# Patient Record
Sex: Female | Born: 1975 | Race: White | Hispanic: No | Marital: Married
Health system: Southern US, Community
[De-identification: ages and names within clinical notes are randomized; demographics above are authoritative.]

## PROBLEM LIST (undated history)

## (undated) DIAGNOSIS — M5136 Other intervertebral disc degeneration, lumbar region: Secondary | ICD-10-CM

## (undated) DIAGNOSIS — R51 Headache: Secondary | ICD-10-CM

## (undated) DIAGNOSIS — F32A Depression, unspecified: Secondary | ICD-10-CM

## (undated) DIAGNOSIS — G43019 Migraine without aura, intractable, without status migrainosus: Secondary | ICD-10-CM

## (undated) DIAGNOSIS — F419 Anxiety disorder, unspecified: Secondary | ICD-10-CM

## (undated) DIAGNOSIS — E079 Disorder of thyroid, unspecified: Secondary | ICD-10-CM

## (undated) DIAGNOSIS — R519 Headache, unspecified: Secondary | ICD-10-CM

## (undated) DIAGNOSIS — M51369 Other intervertebral disc degeneration, lumbar region without mention of lumbar back pain or lower extremity pain: Secondary | ICD-10-CM

## (undated) DIAGNOSIS — M5126 Other intervertebral disc displacement, lumbar region: Secondary | ICD-10-CM

## (undated) DIAGNOSIS — F329 Major depressive disorder, single episode, unspecified: Secondary | ICD-10-CM

## (undated) HISTORY — DX: Other intervertebral disc degeneration, lumbar region: M51.36

## (undated) HISTORY — DX: Other intervertebral disc displacement, lumbar region: M51.26

## (undated) HISTORY — DX: Disorder of thyroid, unspecified: E07.9

## (undated) HISTORY — PX: NO PAST SURGERIES: SHX2092

## (undated) HISTORY — DX: Anxiety disorder, unspecified: F41.9

## (undated) HISTORY — DX: Major depressive disorder, single episode, unspecified: F32.9

## (undated) HISTORY — DX: Other intervertebral disc degeneration, lumbar region without mention of lumbar back pain or lower extremity pain: M51.369

## (undated) HISTORY — DX: Headache, unspecified: R51.9

## (undated) HISTORY — DX: Migraine without aura, intractable, without status migrainosus: G43.019

## (undated) HISTORY — DX: Depression, unspecified: F32.A

## (undated) HISTORY — DX: Headache: R51

---

## 2016-05-31 ENCOUNTER — Ambulatory Visit (INDEPENDENT_AMBULATORY_CARE_PROVIDER_SITE_OTHER): Payer: BC Managed Care – PPO | Admitting: Neurology

## 2016-05-31 ENCOUNTER — Encounter: Payer: Self-pay | Admitting: Neurology

## 2016-05-31 DIAGNOSIS — G43019 Migraine without aura, intractable, without status migrainosus: Secondary | ICD-10-CM

## 2016-05-31 HISTORY — DX: Migraine without aura, intractable, without status migrainosus: G43.019

## 2016-05-31 MED ORDER — SUMATRIPTAN SUCCINATE 100 MG PO TABS: 100.0000 mg | ORAL_TABLET | Freq: Two times a day (BID) | ORAL | 3 refills | Status: DC | PRN

## 2016-05-31 MED ORDER — TOPIRAMATE 50 MG PO TABS: ORAL_TABLET | ORAL | 2 refills | Status: DC

## 2016-05-31 NOTE — Progress Notes (Signed)
Reason for visit: Intractable migraine  Referring physician: Dr. Jerilynn Som Min is a 41 y.o. female  History of present illness:  Ms. Nussbaumer is a 41 year old right-handed white female with a history of migraine headaches since she was around age 64. The patient indicates that her usual headaches are in the frontal regions, and are not severe and are usually responsive to over-the-counter medication. The patient will have on average 2 headaches a week. Around 05/16/2016, the patient began having episodes of blurring of vision, diaphoresis, and near-syncope associated with sharp shooting pains on the left side of the head. After the dizziness and sharp shooting pains resolved, the patient has a dull achy pain that persists throughout the day on the left side of the head. These headaches of been daily in nature since that time, still with the episodes of dizziness and sharp shooting pains at times. The patient has not had any focal numbness or weakness of the face, arms, or legs. She does have some tingling sensations in the hands associated with the use of Topamax. She currently is on 100 mg of Topamax daily. She was given Fioricet to take if needed for pain, but she indicates that this makes her feel badly. The patient is on a prednisone Dosepak without benefit. She has undergone a MRI of the brain, this was brought for my review, but I am unable to bring up the images on our computer. The patient denies any falls, she does have some mild gait instability with the dizziness. She denies issues controlling the bowels or the bladder, she denies neck stiffness. She denies a family history of migraine headache. She did get a migraine cocktail in the emergency room that did help her headache transiently. She is sent to this office for an evaluation.   Past Medical History:  Diagnosis Date  . Anxiety   . Depression     Past Surgical History:  Procedure Laterality Date  . NO PAST SURGERIES        Family History  Problem Relation Age of Onset  . Thyroid disease Mother   . Alzheimer's disease Maternal Grandmother   . Stroke Paternal Grandmother     Social history:  does not have a smoking history on file. She has never used smokeless tobacco. She reports that she drinks alcohol. She reports that she does not use drugs.  Medications:  Prior to Admission medications   Medication Sig Start Date End Date Taking? Authorizing Provider  buPROPion (WELLBUTRIN XL) 300 MG 24 hr tablet Take 1 tablet by mouth daily. 05/03/16  Yes Historical Provider, MD  butalbital-acetaminophen-caffeine (FIORICET, ESGIC) 50-325-40 MG tablet Take 1 tablet by mouth as needed. 05/28/16  Yes Historical Provider, MD  predniSONE (DELTASONE) 20 MG tablet Take 2.5 tablets by mouth daily. Going to 1.5 tablets on 06/01/16 daily. Tapering down 05/27/16  Yes Historical Provider, MD  sertraline (ZOLOFT) 50 MG tablet Take 50 mg by mouth daily. 05/21/16  Yes Historical Provider, MD  topiramate (TOPAMAX) 50 MG tablet 1 tablet in am and 2 tablet in pm 05/31/16  Yes York Spaniel, MD  SUMAtriptan (IMITREX) 100 MG tablet Take 1 tablet (100 mg total) by mouth 2 (two) times daily as needed for migraine. 05/31/16   York Spaniel, MD     No Known Allergies  ROS:  Out of a complete 14 system review of symptoms, the patient complains only of the following symptoms, and all other reviewed systems are negative.  Blurred vision,  double vision Headache, dizziness  Blood pressure 132/87, pulse 91, height  (1.676 m), weight 248 lb (112.5 kg).  Physical Exam  General: The patient is alert and cooperative at the time of the examination. The patient is markedly obese.  Eyes: Pupils are equal, round, and reactive to light. Discs are flat bilaterally.  Neck: The neck is supple, no carotid bruits are noted.  Respiratory: The respiratory examination is clear.  Cardiovascular: The cardiovascular examination reveals a regular  rate and rhythm, no obvious murmurs or rubs are noted.  Neuromuscular: No crepitus is noted in the temporomandibular joints.  Skin: Extremities are without significant edema.  Neurologic Exam  Mental status: The patient is alert and oriented x 3 at the time of the examination. The patient has apparent normal recent and remote memory, with an apparently normal attention span and concentration ability.  Cranial nerves: Facial symmetry is present. There is good sensation of the face to pinprick and soft touch bilaterally. The strength of the facial muscles and the muscles to head turning and shoulder shrug are normal bilaterally. Speech is well enunciated, no aphasia or dysarthria is noted. Extraocular movements are full. Visual fields are full. The tongue is midline, and the patient has symmetric elevation of the soft palate. No obvious hearing deficits are noted.  Motor: The motor testing reveals 5 over 5 strength of all 4 extremities. Good symmetric motor tone is noted throughout.  Sensory: Sensory testing is intact to pinprick, soft touch, vibration sensation, and position sense on all 4 extremities. No evidence of extinction is noted.  Coordination: Cerebellar testing reveals good finger-nose-finger and heel-to-shin bilaterally.  Gait and station: Gait is normal. Tandem gait is normal. Romberg is negative. No drift is seen.  Reflexes: Deep tendon reflexes are symmetric and normal bilaterally. Toes are downgoing bilaterally.   Assessment/Plan:  1. Intractable migraine headache, converted migraine  The patient has a long-standing history of migraine headaches since age 47. She has had a recent MRI of the brain, we will need to get the written report of this. The patient has been placed on Topamax, we will go up on the dose to 150 mg daily. The patient will be given Imitrex to take if needed. She will receive a Depacon injection and Toradol today in our infusion lab. She will call within the  next week or 2 if she is not doing well, we may need to add other medication to help the migraine. The patient may return to work if she feels that she can function. She will follow-up in 2 months.   Marlan Palau MD 05/31/2016 11:38 AM  [facility] [address] [place], [place] [address]  Phone [phone] Fax [phone]

## 2016-05-31 NOTE — Patient Instructions (Signed)
   We will go up on the Topamax to 150 mg daily, take Imitrex if needed.  Topamax (topiramate) is a seizure medication that has an FDA approval for seizures and for migraine headache. Potential side effects of this medication include weight loss, cognitive slowing, tingling in the fingers and toes, and carbonated drinks will taste bad. If any significant side effects are noted on this drug, please contact our office.

## 2016-06-04 ENCOUNTER — Telehealth: Payer: Self-pay | Admitting: Neurology

## 2016-06-04 NOTE — Telephone Encounter (Signed)
MRI of the brain that was done on 05/27/2016 shows no acute intracranial abnormality, small subcortical white matter hyperintensities in the frontal lobes were seen bilaterally, are nonspecific, may be related to chronic microvascular ischemia, migraine headache, vasculitis, or demyelinating disease.  This MRI was done at [facility].

## 2016-07-31 ENCOUNTER — Ambulatory Visit (INDEPENDENT_AMBULATORY_CARE_PROVIDER_SITE_OTHER): Payer: BC Managed Care – PPO | Admitting: Adult Health

## 2016-07-31 ENCOUNTER — Encounter (INDEPENDENT_AMBULATORY_CARE_PROVIDER_SITE_OTHER): Payer: Self-pay

## 2016-07-31 VITALS — BP 130/83 | HR 76 | Wt 248.6 lb

## 2016-07-31 DIAGNOSIS — G43711 Chronic migraine without aura, intractable, with status migrainosus: Secondary | ICD-10-CM | POA: Diagnosis not present

## 2016-07-31 MED ORDER — TOPIRAMATE 100 MG PO TABS: 100.0000 mg | ORAL_TABLET | Freq: Two times a day (BID) | ORAL | 11 refills | Status: DC

## 2016-07-31 MED ORDER — RIZATRIPTAN BENZOATE 5 MG PO TABS: ORAL_TABLET | ORAL | 5 refills | Status: DC

## 2016-07-31 NOTE — Patient Instructions (Addendum)
Increase Topamax to 100 mg twice a day Try Maxalt Talk with OBGYN regarding Topamax interfering with IUD  If your symptoms worsen or you develop new symptoms please let us know.   Rizatriptan tablets What is this medicine? RIZATRIPTAN (rye za TRIP tan) is used to treat migraines with or without aura. An aura is a strange feeling or visual disturbance that warns you of an attack. It is not used to prevent migraines. This medicine may be used for other purposes; ask your health care provider or pharmacist if you have questions. COMMON BRAND NAME(S): Maxalt What should I tell my health care provider before I take this medicine? They need to know if you have any of these conditions: -bowel disease or colitis -diabetes -family history of heart disease -fast or irregular heart beat -heart or blood vessel disease, angina (chest pain), or previous heart attack -high blood pressure -high cholesterol -history of stroke, transient ischemic attacks (TIAs or mini-strokes), or intracranial bleeding -kidney or liver disease -overweight -poor circulation -postmenopausal or surgical removal of uterus and ovaries -Raynaud's disease -seizure disorder -an unusual or allergic reaction to rizatriptan, other medicines, foods, dyes, or preservatives -pregnant or trying to get pregnant -breast-feeding How should I use this medicine? This medicine is taken by mouth with a glass of water. Follow the directions on the prescription label. This medicine is taken at the first symptoms of a migraine. It is not for everyday use. If your migraine headache returns after one dose, you can take another dose as directed. You must leave at least 2 hours between doses, and do not take more than 30 mg total in 24 hours. If there is no improvement at all after the first dose, do not take a second dose without talking to your doctor or health care professional. Do not take your medicine more often than directed. Talk to your  pediatrician regarding the use of this medicine in children. While this drug may be prescribed for children as young as 6 years for selected conditions, precautions do apply. Overdosage: If you think you have taken too much of this medicine contact a poison control center or emergency room at once. NOTE: This medicine is only for you. Do not share this medicine with others. What if I miss a dose? This does not apply; this medicine is not for regular use. What may interact with this medicine? Do not take this medicine with any of the following medicines: -amphetamine, dextroamphetamine or cocaine -dihydroergotamine, ergotamine, ergoloid mesylates, methysergide, or ergot-type medication - do not take within 24 hours of taking rizatriptan -feverfew -MAOIs like Carbex, Eldepryl, Marplan, Nardil, and Parnate - do not take rizatriptan within 2 weeks of stopping MAOI therapy. -other migraine medicines like almotriptan, eletriptan, naratriptan, sumatriptan, zolmitriptan - do not take within 24 hours of taking rizatriptan -tryptophan This medicine may also interact with the following medications: -medicines for mental depression, anxiety or mood problems -propranolol This list may not describe all possible interactions. Give your health care provider a list of all the medicines, herbs, non-prescription drugs, or dietary supplements you use. Also tell them if you smoke, drink alcohol, or use illegal drugs. Some items may interact with your medicine. What should I watch for while using this medicine? Only take this medicine for a migraine headache. Take it if you get warning symptoms or at the start of a migraine attack. It is not for regular use to prevent migraine attacks. You may get drowsy or dizzy. Do not drive, use machinery,  or do anything that needs mental alertness until you know how this medicine affects you. To reduce dizzy or fainting spells, do not sit or stand up quickly, especially if you are  an older patient. Alcohol can increase drowsiness, dizziness and flushing. Avoid alcoholic drinks. Smoking cigarettes may increase the risk of heart-related side effects from using this medicine. If you take migraine medicines for 10 or more days a month, your migraines may get worse. Keep a diary of headache days and medicine use. Contact your healthcare professional if your migraine attacks occur more frequently. What side effects may I notice from receiving this medicine? Side effects that you should report to your doctor or health care professional as soon as possible: -allergic reactions like skin rash, itching or hives, swelling of the face, lips, or tongue -fast, slow, or irregular heart beat -increased or decreased blood pressure -seizures -severe stomach pain and cramping, bloody diarrhea -signs and symptoms of a blood clot such as breathing problems; changes in vision; chest pain; severe, sudden headache; pain, swelling, warmth in the leg; trouble speaking; sudden numbness or weakness of the face, arm or leg -tingling, pain, or numbness in the face, hands, or feet Side effects that usually do not require medical attention (report to your doctor or health care professional if they continue or are bothersome): -drowsiness -dry mouth -feeling warm, flushing, or redness of the face -headache -muscle cramps, pain -nausea, vomiting -unusually weak or tired This list may not describe all possible side effects. Call your doctor for medical advice about side effects. You may report side effects to FDA at 1-800-FDA-1088. Where should I keep my medicine? Keep out of the reach of children. Store at room temperature between 15 and 30 degrees C (59 and 86 degrees F). Keep container tightly closed. Throw away any unused medicine after the expiration date. NOTE: This sheet is a summary. It may not cover all possible information. If you have questions about this medicine, talk to your doctor,  pharmacist, or health care provider.  2018 Elsevier/Gold Standard (2012-10-20 10:16:39)

## 2016-07-31 NOTE — Progress Notes (Signed)
PATIENT: Carolyn Warner DOB: August 21, 1975  REASON FOR VISIT: follow up HISTORY FROM: patient  HISTORY OF PRESENT ILLNESS: Carolyn Warner is a 41 year old female with a history of migraine headaches. She returns today for follow-up. She states that she has approximately 1 headache a week. She reports that the headache severity may improve but she has a dull headache that lingers 2-3 days. She states that her headaches always occur in the left frontal region. She does have photophobia, phonophobia, nausea, dizziness and blurred vision with her headaches. She states that she did try Imitrex however it made her head feel heavy and made her very sleepy. She states that she cannot work in taking this medication at the same time. She begin taken Fioricet at the end of April she states that that helps some. She continues on Topamax 150 mg daily. Reports that she is tolerating this medication well. She returns today for an evaluation.    HISTORY 05/31/16: Carolyn Warner is a 41 year old right-handed white female with a history of migraine headaches since she was around age 89. The patient indicates that her usual headaches are in the frontal regions, and are not severe and are usually responsive to over-the-counter medication. The patient will have on average 2 headaches a week. Around 05/16/2016, the patient began having episodes of blurring of vision, diaphoresis, and near-syncope associated with sharp shooting pains on the left side of the head. After the dizziness and sharp shooting pains resolved, the patient has a dull achy pain that persists throughout the day on the left side of the head. These headaches of been daily in nature since that time, still with the episodes of dizziness and sharp shooting pains at times. The patient has not had any focal numbness or weakness of the face, arms, or legs. She does have some tingling sensations in the hands associated with the use of Topamax. She currently is on 100 mg of  Topamax daily. She was given Fioricet to take if needed for pain, but she indicates that this makes her feel badly. The patient is on a prednisone Dosepak without benefit. She has undergone a MRI of the brain, this was brought for my review, but I am unable to bring up the images on our computer. The patient denies any falls, she does have some mild gait instability with the dizziness. She denies issues controlling the bowels or the bladder, she denies neck stiffness. She denies a family history of migraine headache. She did get a migraine cocktail in the emergency room that did help her headache transiently. She is sent to this office for an evaluation.    REVIEW OF SYSTEMS: Out of a complete 14 system review of symptoms, the patient complains only of the following symptoms, and all other reviewed systems are negative.  Dizziness, headache ALLERGIES: No Known Allergies  HOME MEDICATIONS: Outpatient Medications Prior to Visit  Medication Sig Dispense Refill  . buPROPion (WELLBUTRIN XL) 300 MG 24 hr tablet Take 1 tablet by mouth daily.    . butalbital-acetaminophen-caffeine (FIORICET, ESGIC) 50-325-40 MG tablet Take 1 tablet by mouth as needed.    . sertraline (ZOLOFT) 50 MG tablet Take 50 mg by mouth daily.    . SUMAtriptan (IMITREX) 100 MG tablet Take 1 tablet (100 mg total) by mouth 2 (two) times daily as needed for migraine. 10 tablet 3  . topiramate (TOPAMAX) 50 MG tablet 1 tablet in am and 2 tablet in pm 90 tablet 2  . predniSONE (DELTASONE) 20 MG  tablet Take 2.5 tablets by mouth daily. Going to 1.5 tablets on 06/01/16 daily. Tapering down     No facility-administered medications prior to visit.     PAST MEDICAL HISTORY: Past Medical History:  Diagnosis Date  . Anxiety   . Common migraine with intractable migraine 05/31/2016  . Depression     PAST SURGICAL HISTORY: Past Surgical History:  Procedure Laterality Date  . NO PAST SURGERIES      FAMILY HISTORY: Family History    Problem Relation Age of Onset  . Thyroid disease Mother   . Alzheimer's disease Maternal Grandmother   . Stroke Paternal Grandmother     SOCIAL HISTORY: Social History   Social History  . Marital status: Legally Separated    Spouse name: N/A  . Number of children: 3  . Years of education: College   Occupational History  . [employer]    Social History Main Topics  . Smoking status: Not on file  . Smokeless tobacco: Never Used  . Alcohol use Yes     Comment: ocassional (socially)  . Drug use: No  . Sexual activity: Not on file   Other Topics Concern  . Not on file   Social History Narrative   Lives with 3 kids   Caffeine use: occasional (coffee)   Tea-half and half- once per week   Right-handed         PHYSICAL EXAM  Vitals:   07/31/16 1251  BP: 130/83  Pulse: 76  Weight: 248 lb 9.6 oz (112.8 kg)   Body mass index is 40.13 kg/m.  Generalized: Well developed, in no acute distress   Neurological examination  Mentation: Alert oriented to time, place, history taking. Follows all commands speech and language fluent Cranial nerve II-XII: Pupils were equal round reactive to light. Extraocular movements were full, visual field were full on confrontational test. Facial sensation and strength were normal. Uvula tongue midline. Head turning and shoulder shrug  were normal and symmetric. Motor: The motor testing reveals 5 over 5 strength of all 4 extremities. Good symmetric motor tone is noted throughout.  Sensory: Sensory testing is intact to soft touch on all 4 extremities. No evidence of extinction is noted.  Coordination: Cerebellar testing reveals good finger-nose-finger and heel-to-shin bilaterally.  Gait and station: Gait is normal. Tandem gait is normal. Romberg is negative. No drift is seen.  Reflexes: Deep tendon reflexes are symmetric and normal bilaterally.   DIAGNOSTIC DATA (LABS, IMAGING, TESTING) - I reviewed patient records, labs, notes,  testing and imaging myself where available.    ASSESSMENT AND PLAN 41 y.o. year old female  has a past medical history of Anxiety; Common migraine with intractable migraine (05/31/2016); and Depression. here with:  1. Migraine headaches  The patient continues to have 2-3 headache days a week. We will increase Topamax to 100 mg twice a day. The patient reports that she has an IUD in place. She is unsure what type of IUD she has placed. I advised that higher dosages of Topamax can sometimes reduce the efficacy of the medication released in IUDs particularly progesterone. Advised that she should consult with her OB/GYN . If she is sexually active she should consider using a backup method. I explained the Topamax is a category D for pregnancy. The patient will try Maxalt she preferred to try the lower dose first. She was given 5 mg. Advised to take at the onset of migraine and can repeat in 2 hours if the headache  has not resolved. She was given a printout reviewing the side effects of Maxalt. If these interventions are not beneficial she will let us know. She will follow-up in 4 months or sooner if needed.  .I spent 15 minutes with the patient. 50% of this time was spent reviewing medication side effects.   Butch Penny, MSN, NP-C 07/31/2016, 1:30 PM [facility] [address], [address] [place], [place] [address] [phone]

## 2016-07-31 NOTE — Progress Notes (Signed)
I have read the note, and I agree with the clinical assessment and plan.  WILLIS,CHARLES KEITH   

## 2016-11-27 ENCOUNTER — Ambulatory Visit: Payer: BC Managed Care – PPO | Admitting: Adult Health

## 2016-12-23 ENCOUNTER — Encounter: Payer: Self-pay | Admitting: Adult Health

## 2016-12-23 ENCOUNTER — Ambulatory Visit (INDEPENDENT_AMBULATORY_CARE_PROVIDER_SITE_OTHER): Payer: BC Managed Care – PPO | Admitting: Adult Health

## 2016-12-23 VITALS — BP 126/88 | HR 77 | Ht 66.0 in | Wt 246.2 lb

## 2016-12-23 DIAGNOSIS — G43011 Migraine without aura, intractable, with status migrainosus: Secondary | ICD-10-CM | POA: Diagnosis not present

## 2016-12-23 MED ORDER — PREDNISONE 5 MG PO TABS: ORAL_TABLET | ORAL | 0 refills | Status: DC

## 2016-12-23 MED ORDER — KETOROLAC TROMETHAMINE 60 MG/2ML IM SOLN
30.0000 mg | Freq: Once | INTRAMUSCULAR | Status: AC
  Administered 2016-12-23: 30 mg via INTRAMUSCULAR

## 2016-12-23 MED ORDER — KETOROLAC TROMETHAMINE 30 MG/ML IJ SOLN: 30.0000 mg | Freq: Once | INTRAMUSCULAR | Status: DC

## 2016-12-23 MED ORDER — RIZATRIPTAN BENZOATE 10 MG PO TABS: ORAL_TABLET | ORAL | 5 refills | Status: DC

## 2016-12-23 NOTE — Progress Notes (Signed)
I have read the note, and I agree with the clinical assessment and plan.  WILLIS,CHARLES KEITH   

## 2016-12-23 NOTE — Patient Instructions (Signed)
Your Plan:  Continue Topamax 200 mg daily Can try taking naproxen 500 mg with your Maxalt Prednisone dose pack for current headache  To prevent or relieve headaches, try the following: Cool Compress. Lie down and place a cool compress on your head.  Avoid headache triggers. If certain foods or odors seem to have triggered your migraines in the past, avoid them. A headache diary might help you identify triggers.  Include physical activity in your daily routine. Try a daily walk or other moderate aerobic exercise.  Manage stress. Find healthy ways to cope with the stressors, such as delegating tasks on your to-do list.  Practice relaxation techniques. Try deep breathing, yoga, massage and visualization.  Eat regularly. Eating regularly scheduled meals and maintaining a healthy diet might help prevent headaches. Also, drink plenty of fluids.  Follow a regular sleep schedule. Sleep deprivation might contribute to headaches Consider biofeedback. With this mind-body technique, you learn to control certain bodily functions - such as muscle tension, heart rate and blood pressure - to prevent headaches or reduce headache pain.    Proceed to emergency room if you experience new or worsening symptoms or symptoms do not resolve, if you have new neurologic symptoms or if headache is severe, or for any concerning symptom.   Prednisone tablets What is this medicine? PREDNISONE (PRED ni sone) is a corticosteroid. It is commonly used to treat inflammation of the skin, joints, lungs, and other organs. Common conditions treated include asthma, allergies, and arthritis. It is also used for other conditions, such as blood disorders and diseases of the adrenal glands. This medicine may be used for other purposes; ask your health care provider or pharmacist if you have questions. COMMON BRAND NAME(S): Deltasone, Predone, Sterapred, Sterapred DS What should I tell my health care provider before I take this  medicine? They need to know if you have any of these conditions: -Cushing's syndrome -diabetes -glaucoma -heart disease -high blood pressure -infection (especially a virus infection such as chickenpox, cold sores, or herpes) -kidney disease -liver disease -mental illness -myasthenia gravis -osteoporosis -seizures -stomach or intestine problems -thyroid disease -an unusual or allergic reaction to lactose, prednisone, other medicines, foods, dyes, or preservatives -pregnant or trying to get pregnant -breast-feeding How should I use this medicine? Take this medicine by mouth with a glass of water. Follow the directions on the prescription label. Take this medicine with food. If you are taking this medicine once a day, take it in the morning. Do not take more medicine than you are told to take. Do not suddenly stop taking your medicine because you may develop a severe reaction. Your doctor will tell you how much medicine to take. If your doctor wants you to stop the medicine, the dose may be slowly lowered over time to avoid any side effects. Talk to your pediatrician regarding the use of this medicine in children. Special care may be needed. Overdosage: If you think you have taken too much of this medicine contact a poison control center or emergency room at once. NOTE: This medicine is only for you. Do not share this medicine with others. What if I miss a dose? If you miss a dose, take it as soon as you can. If it is almost time for your next dose, talk to your doctor or health care professional. You may need to miss a dose or take an extra dose. Do not take double or extra doses without advice. What may interact with this medicine? Do not take  this medicine with any of the following medications: -metyrapone -mifepristone This medicine may also interact with the following medications: -aminoglutethimide -amphotericin B -aspirin and aspirin-like medicines -barbiturates -certain  medicines for diabetes, like glipizide or glyburide -cholestyramine -cholinesterase inhibitors -cyclosporine -digoxin -diuretics -ephedrine -female hormones, like estrogens and birth control pills -isoniazid -ketoconazole -NSAIDS, medicines for pain and inflammation, like ibuprofen or naproxen -phenytoin -rifampin -toxoids -vaccines -warfarin This list may not describe all possible interactions. Give your health care provider a list of all the medicines, herbs, non-prescription drugs, or dietary supplements you use. Also tell them if you smoke, drink alcohol, or use illegal drugs. Some items may interact with your medicine. What should I watch for while using this medicine? Visit your doctor or health care professional for regular checks on your progress. If you are taking this medicine over a prolonged period, carry an identification card with your name and address, the type and dose of your medicine, and your doctor's name and address. This medicine may increase your risk of getting an infection. Tell your doctor or health care professional if you are around anyone with measles or chickenpox, or if you develop sores or blisters that do not heal properly. If you are going to have surgery, tell your doctor or health care professional that you have taken this medicine within the last twelve months. Ask your doctor or health care professional about your diet. You may need to lower the amount of salt you eat. This medicine may affect blood sugar levels. If you have diabetes, check with your doctor or health care professional before you change your diet or the dose of your diabetic medicine. What side effects may I notice from receiving this medicine? Side effects that you should report to your doctor or health care professional as soon as possible: -allergic reactions like skin rash, itching or hives, swelling of the face, lips, or tongue -changes in emotions or moods -changes in  vision -depressed mood -eye pain -fever or chills, cough, sore throat, pain or difficulty passing urine -increased thirst -swelling of ankles, feet Side effects that usually do not require medical attention (report to your doctor or health care professional if they continue or are bothersome): -confusion, excitement, restlessness -headache -nausea, vomiting -skin problems, acne, thin and shiny skin -trouble sleeping -weight gain This list may not describe all possible side effects. Call your doctor for medical advice about side effects. You may report side effects to FDA at 1-800-FDA-1088. Where should I keep my medicine? Keep out of the reach of children. Store at room temperature between 15 and 30 degrees C (59 and 86 degrees F). Protect from light. Keep container tightly closed. Throw away any unused medicine after the expiration date. NOTE: This sheet is a summary. It may not cover all possible information. If you have questions about this medicine, talk to your doctor, pharmacist, or health care provider.  2018 Elsevier/Gold Standard (2010-10-04 10:57:14)    Thank you for coming to see [place] at [facility]. I hope we have been able to provide you high quality care today.  You may receive a patient satisfaction survey over the next few weeks. We would appreciate your feedback and comments so that we may continue to improve ourselves and the health of our patients.

## 2016-12-23 NOTE — Progress Notes (Signed)
Ketorolac 30 mg (60 mg/2 ml) administered IM to L deltoid using aseptic technique. Pt tolerated well.Bandaid applied.

## 2016-12-23 NOTE — Progress Notes (Signed)
PATIENT: Carolyn Warner DOB: 04/07/1975  REASON FOR VISIT: follow up HISTORY FROM: patient  HISTORY OF PRESENT ILLNESS: Today 12/23/16 Carolyn Warner is a 41 year old female with a history of migraine headaches. She returns today for follow-up. She continues on Topamax 100 mg twice a day. She reports that she has approximately 2 migraines a month. She states that Maxalt has offered her some benefit however with severe migraines it does not resolve the headache. She reports that her headaches typically occur behind both eyes. She does have some blurred vision as well as photophobia with the headaches. She states in September with her migraines she did experience nausea and vomiting. She reports typically her migraines will last approximately 24 hours. She states that she currently has a headache. Reports that it started Saturday and has lingered till today. She reports that she has found that her contacts as well as sleep deprivation are a trigger for her headaches. She is currently working night shift but is considering requesting day shift if she continues to have headaches. She returns today for an evaluation.  HISTORY 07/31/16: Carolyn Warner is a 41 year old female with a history of migraine headaches. She returns today for follow-up. She states that she has approximately 1 headache a week. She reports that the headache severity may improve but she has a dull headache that lingers 2-3 days. She states that her headaches always occur in the left frontal region. She does have photophobia, phonophobia, nausea, dizziness and blurred vision with her headaches. She states that she did try Imitrex however it made her head feel heavy and made her very sleepy. She states that she cannot work in taking this medication at the same time. She begin taken Fioricet at the end of April she states that that helps some. She continues on Topamax 150 mg daily. Reports that she is tolerating this medication well. She returns  today for an evaluation.   REVIEW OF SYSTEMS: Out of a complete 14 system review of symptoms, the patient complains only of the following symptoms, and all other reviewed systems are negative.  Headache, numbness  ALLERGIES: No Known Allergies  HOME MEDICATIONS: Outpatient Medications Prior to Visit  Medication Sig Dispense Refill  . buPROPion (WELLBUTRIN XL) 300 MG 24 hr tablet Take 1 tablet by mouth daily.    . rizatriptan (MAXALT) 5 MG tablet Take 1 tablet at the onset of migraine. May repeat in 2 hours if needed 9 tablet 5  . sertraline (ZOLOFT) 50 MG tablet Take 50 mg by mouth daily.    Marland Kitchen SYNTHROID 175 MCG tablet 175 mcg daily.  11  . topiramate (TOPAMAX) 100 MG tablet Take 1 tablet (100 mg total) by mouth 2 (two) times daily. 60 tablet 11  . butalbital-acetaminophen-caffeine (FIORICET, ESGIC) 50-325-40 MG tablet Take 1 tablet by mouth as needed.     No facility-administered medications prior to visit.     PAST MEDICAL HISTORY: Past Medical History:  Diagnosis Date  . Anxiety   . Common migraine with intractable migraine 05/31/2016  . Depression     PAST SURGICAL HISTORY: Past Surgical History:  Procedure Laterality Date  . NO PAST SURGERIES      FAMILY HISTORY: Family History  Problem Relation Age of Onset  . Thyroid disease Mother   . Alzheimer's disease Maternal Grandmother   . Stroke Paternal Grandmother     SOCIAL HISTORY: Social History   Social History  . Marital status: Legally Separated    Spouse name: N/A  .  Number of children: 3  . Years of education: College   Occupational History  . [employer]    Social History Main Topics  . Smoking status: Never Smoker  . Smokeless tobacco: Never Used  . Alcohol use No  . Drug use: No  . Sexual activity: Not on file   Other Topics Concern  . Not on file   Social History Narrative   Lives with 3 kids   Caffeine use: occasional (coffee)   Tea-half and half- once per week   Right-handed           PHYSICAL EXAM  Vitals:   12/23/16 1502  BP: 126/88  Pulse: 77  Weight: 246 lb 3.2 oz (111.7 kg)  Height: 5\' 6"  (1.676 m)   Body mass index is 39.74 kg/m.  Generalized: Well developed, in no acute distress   Neurological examination  Mentation: Alert oriented to time, place, history taking. Follows all commands speech and language fluent Cranial nerve II-XII: Pupils were equal round reactive to light. Extraocular movements were full, visual field were full on confrontational test. Facial sensation and strength were normal. Uvula tongue midline. Head turning and shoulder shrug  were normal and symmetric. Motor: The motor testing reveals 5 over 5 strength of all 4 extremities. Good symmetric motor tone is noted throughout.  Sensory: Sensory testing is intact to soft touch on all 4 extremities. No evidence of extinction is noted.  Coordination: Cerebellar testing reveals good finger-nose-finger and heel-to-shin bilaterally.  Gait and station: Gait is normal. Tandem gait is normal. Romberg is negative. No drift is seen.  Reflexes: Deep tendon reflexes are symmetric and normal bilaterally.   DIAGNOSTIC DATA (LABS, IMAGING, TESTING) - I reviewed patient records, labs, notes, testing and imaging myself where available.     ASSESSMENT AND PLAN 41 y.o. year old female  has a past medical history of Anxiety; Common migraine with intractable migraine (05/31/2016); and Depression. here with:  1. Migraine headaches  The patient will continue on Topamax 200 mg daily. We will increase Maxalt 10 mg to be taken at the onset of a migraine. This could be repeated in 2 hours if the headache has not resolved. Also advised that she can try taking Naproxen 500 mg with the Maxalt to see if this offers her any additional benefit. If the increase in Maxalt is not beneficial we can try Cambia. The patient is amenable to this plan. The patient has had IV infusion the past with good benefit. However  we are unable to get her in the infusion suite today. I stated she could have an infusion tomorrow or perhaps we can try prednisone Dosepak. She is amenable to trying the prednisone. I have reviewed potential side effects with the patient. She will also receive a Toradol IM injection today 30 mg. She is advised that if her symptoms worsen or she develops new symptoms she should let us know. She will follow-up in 3 months or sooner if needed.    Butch PennyMegan Merry Pond, MSN, NP-C 12/23/2016, 3:10 PM [facility] [address], [address] [place], [place] [address] [phone]

## 2016-12-30 ENCOUNTER — Telehealth: Payer: Self-pay | Admitting: Adult Health

## 2016-12-30 NOTE — Telephone Encounter (Signed)
Patient calling to see if FMLA paperwork was received. She said [place] was fax[place] directly to [place].

## 2016-12-30 NOTE — Telephone Encounter (Signed)
LMVM to pt and have not received the FMLA p/w. (neither [facility]/RN or [place] [facility] in [place]).  I gave her the [phone] # to fax.

## 2017-01-06 NOTE — Telephone Encounter (Signed)
Patient calling to find out if FMLA papers have been received. She has faxed several times to our office.

## 2017-01-07 NOTE — Telephone Encounter (Signed)
LVM advising patient that the reason her fax is not coming through is probably on her end. Gave her Med records fax # and fax # to this RN's pod. Also advised she can bring to our office or mail to our office.  Left number for questions.

## 2017-04-09 ENCOUNTER — Ambulatory Visit: Payer: BC Managed Care – PPO | Admitting: Adult Health

## 2017-06-30 ENCOUNTER — Encounter (HOSPITAL_COMMUNITY): Payer: Self-pay | Admitting: Emergency Medicine

## 2017-06-30 ENCOUNTER — Emergency Department (HOSPITAL_COMMUNITY): Payer: BC Managed Care – PPO

## 2017-06-30 ENCOUNTER — Emergency Department (HOSPITAL_COMMUNITY)
Admission: EM | Admit: 2017-06-30 | Discharge: 2017-07-01 | Disposition: A | Discharge status: Home / Self Care | Payer: BC Managed Care – PPO | Attending: Emergency Medicine | Admitting: Emergency Medicine

## 2017-06-30 DIAGNOSIS — M5441 Lumbago with sciatica, right side: Secondary | ICD-10-CM | POA: Diagnosis not present

## 2017-06-30 DIAGNOSIS — Z79899 Other long term (current) drug therapy: Secondary | ICD-10-CM | POA: Insufficient documentation

## 2017-06-30 DIAGNOSIS — M545 Low back pain: Secondary | ICD-10-CM | POA: Diagnosis present

## 2017-06-30 DIAGNOSIS — M5442 Lumbago with sciatica, left side: Secondary | ICD-10-CM | POA: Insufficient documentation

## 2017-06-30 LAB — BASIC METABOLIC PANEL
Anion gap: 8 (ref 5–15)
BUN: 13 mg/dL (ref 6–20)
CHLORIDE: 110 mmol/L (ref 101–111)
CO2: 22 mmol/L (ref 22–32)
Calcium: 9.1 mg/dL (ref 8.9–10.3)
Creatinine, Ser: 1.21 mg/dL — ABNORMAL HIGH (ref 0.44–1.00)
GFR, EST NON AFRICAN AMERICAN: 55 mL/min — AB (ref >60)
Glucose, Bld: 86 mg/dL (ref 65–99)
POTASSIUM: 3.7 mmol/L (ref 3.5–5.1)
SODIUM: 140 mmol/L (ref 135–145)

## 2017-06-30 LAB — CBC
HEMATOCRIT: 43.5 % (ref 36.0–46.0)
HEMOGLOBIN: 14.7 g/dL (ref 12.0–15.0)
MCH: 30.8 pg (ref 26.0–34.0)
MCHC: 33.8 g/dL (ref 30.0–36.0)
MCV: 91.2 fL (ref 78.0–100.0)
Platelets: 276 10*3/uL (ref 150–400)
RBC: 4.77 MIL/uL (ref 3.87–5.11)
RDW: 13.9 % (ref 11.5–15.5)
WBC: 6.8 10*3/uL (ref 4.0–10.5)

## 2017-06-30 LAB — POC URINE PREG, ED: Preg Test, Ur: NEGATIVE

## 2017-06-30 MED ORDER — METHYLPREDNISOLONE SODIUM SUCC 125 MG IJ SOLR
125.0000 mg | Freq: Once | INTRAMUSCULAR | Status: AC
  Administered 2017-06-30: 125 mg via INTRAVENOUS
  Filled 2017-06-30: qty 2

## 2017-06-30 MED ORDER — KETOROLAC TROMETHAMINE 30 MG/ML IJ SOLN
30.0000 mg | Freq: Once | INTRAMUSCULAR | Status: AC
  Administered 2017-06-30: 30 mg via INTRAVENOUS
  Filled 2017-06-30: qty 1

## 2017-06-30 MED ORDER — HYDROMORPHONE HCL 2 MG/ML IJ SOLN
1.0000 mg | Freq: Once | INTRAMUSCULAR | Status: AC
  Administered 2017-06-30: 1 mg via INTRAVENOUS
  Filled 2017-06-30: qty 1

## 2017-06-30 MED ORDER — OXYCODONE-ACETAMINOPHEN 5-325 MG PO TABS
1.0000 | ORAL_TABLET | Freq: Once | ORAL | Status: AC
  Administered 2017-06-30: 1 via ORAL
  Filled 2017-06-30: qty 1

## 2017-06-30 NOTE — ED Notes (Signed)
ED Provider at bedside. 

## 2017-06-30 NOTE — ED Provider Notes (Signed)
Patient placed in Quick Look pathway, seen and evaluated   Chief Complaint: Back pain  HPI:   Patient has known L4/L5 compression and bulging disc, she has been seeing a chiropractor 3 times a week.  She reports worsening pain for the past 5 days.  She denies any new numbness or tingling, no changes to bowel/bladder function.  ROS: No dysuria or fevers.  (one)  Physical Exam:   Gen: No distress  Neuro: Awake and Alert  Skin: Warm    Focused Exam: Sensation intact over bilateral medial thighs.  Bilateral feet are warm and well perfused.   Initiation of care has begun. The patient has been counseled on the process, plan, and necessity for staying for the completion/evaluation, and the remainder of the medical screening examination    Norman Clay 06/30/17 1716    Eber Hong, MD 07/01/17 [phone]

## 2017-06-30 NOTE — ED Notes (Signed)
Provided heat packs for patient to apply to lower back.

## 2017-06-30 NOTE — ED Triage Notes (Signed)
Quick-look EDP assessing patient at this time.

## 2017-06-30 NOTE — ED Provider Notes (Addendum)
TIME SEEN: 11:26 PM  CHIEF COMPLAINT: Back pain  HPI: Patient is a 42 year old female with history of obesity, depression who presents to the emergency department with complaints of lower back pain for the past 4 to 5 weeks.  No injury that she can remember.  States she has been seeing a chiropractor 3 times a week for back pain and was told that she had a bulging L4-L5 disc based on x-rays from her chiropractor.  Has not seen her PCP and [place] yet for this.  Has been taking ibuprofen for pain with relief but states over the past several days symptoms have gotten worse.  She states that she has to sit for long hours at her job which she thinks is exacerbating her pain.  She does report having a tingling in both feet for the past week.  No numbness.  No saddle anesthesia.  No focal weakness.  No bowel or bladder incontinence.  No urinary retention.  No fever.  No history of diabetes, HIV, immunocompromise state, IV drug abuse.  No history of chronic back pain.  No history of back surgery or epidural injections.  States pain does radiate into both legs intermittently.   ROS: See HPI Constitutional: no fever  Eyes: no drainage  ENT: no runny nose   Cardiovascular:  no chest pain  Resp: no SOB  GI: no vomiting GU: no dysuria Integumentary: no rash  Allergy: no hives  Musculoskeletal: no leg swelling  Neurological: no slurred speech ROS otherwise negative  PAST MEDICAL HISTORY/PAST SURGICAL HISTORY:  Past Medical History:  Diagnosis Date  . Anxiety   . Common migraine with intractable migraine 05/31/2016  . Depression     MEDICATIONS:  Prior to Admission medications   Medication Sig Start Date End Date Taking? Authorizing Provider  buPROPion (WELLBUTRIN XL) 300 MG 24 hr tablet Take 1 tablet by mouth daily. 05/03/16   [provider]  predniSONE (DELTASONE) 5 MG tablet Begin taking 6 tablets daily, taper by one tablet daily until off the medication. 12/23/16   Butch Penny, NP   rizatriptan (MAXALT) 10 MG tablet Take 1 tablet at the onset of migraine. May repeat in 2 hours if needed 12/23/16   Butch Penny, NP  sertraline (ZOLOFT) 50 MG tablet Take 50 mg by mouth daily. 05/21/16   [provider]  SYNTHROID 175 MCG tablet 175 mcg daily. 07/18/16   [provider]  topiramate (TOPAMAX) 100 MG tablet Take 1 tablet (100 mg total) by mouth 2 (two) times daily. 07/31/16   Butch Penny, NP    ALLERGIES:  No Known Allergies  SOCIAL HISTORY:  Social History   Tobacco Use  . Smoking status: Never Smoker  . Smokeless tobacco: Never Used  Substance Use Topics  . Alcohol use: No    FAMILY HISTORY: Family History  Problem Relation Age of Onset  . Thyroid disease Mother   . Alzheimer's disease Maternal Grandmother   . Stroke Paternal Grandmother     EXAM: BP (!) 143/101 (BP Location: Right Arm)   Pulse 95   Temp 98.3 F (36.8 C) (Oral)   Resp 18   SpO2 100%  CONSTITUTIONAL: Alert and oriented and responds appropriately to questions.  Obese, tearful, afebrile HEAD: Normocephalic EYES: Conjunctivae clear, pupils appear equal, EOMI ENT: normal nose; moist mucous membranes NECK: Supple, no meningismus, no nuchal rigidity, no LAD  CARD: RRR; S1 and S2 appreciated; no murmurs, no clicks, no rubs, no gallops RESP: Normal chest excursion without splinting  or tachypnea; breath sounds clear and equal bilaterally; no wheezes, no rhonchi, no rales, no hypoxia or respiratory distress, speaking full sentences ABD/GI: Normal bowel sounds; non-distended; soft, non-tender, no rebound, no guarding, no peritoneal signs, no hepatosplenomegaly BACK:  The back appears normal and is tender throughout the thoracic and lumbar spine without step-off or deformity, no erythema or warmth, no ecchymosis or other lesions noted, there is no CVA tenderness EXT: Normal ROM in all joints; non-tender to palpation; no edema; normal capillary refill; no cyanosis, no calf  tenderness or swelling    SKIN: Normal color for age and race; warm; no rash NEURO: Moves all extremities equally, strength 5/5 in all 4 extremities, cranial nerves II through XII intact, normal speech, no saddle anesthesia, normal sensation diffusely, 2+ deep tender reflexes in bilateral upper and lower extremities, no clonus PSYCH: The patient's mood and manner are appropriate. Grooming and personal hygiene are appropriate.  MEDICAL DECISION MAKING: Patient here with back pain.  Labs obtained in triage are unremarkable.  Pregnancy test negative.  X-ray shows degenerative changes but no other acute abnormality.  Doubt cauda equina, significant spinal stenosis, epidural abscess or hematoma, discitis or osteomyelitis at this time.  I feel she needs an MRI for further evaluation but I do not think she needs it emergently.  She feels comfortable with a plan to follow-up with her primary care physician for this.  We will treat her pain with Dilaudid, Toradol, Solu-Medrol and reassess.  Doubt kidney stone, pyelonephritis.  Seems to be musculoskeletal in nature.  Giving her a dose of steroids as I think there is some component of radiculopathy given she reports pain shoots down into her legs intermittently.  ED PROGRESS: Pain now down to a 6/10.  Reports that it was "above a 10" prior to pain medication.  Will give second round of Dilaudid and reassess.  No longer tearful.  Appears more comfortable.   Patient reports her pain is down to a 2/10 even before receiving the second round of pain medication.  She has been able to ambulate.  I feel she is safe for discharge home and she will follow-up closely with her primary care provider.  Will discharge with prescriptions for Percocet, prednisone, ibuprofen.  Discussed return precautions.  Will provide with work note.  Discussed supportive care instructions.  I have reviewed patient records and the [place] controlled substance reporting system database and  appears she has not received any narcotic prescriptions in the past 2 years.   At this time, I do not feel there is any life-threatening condition present. I have reviewed and discussed all results (EKG, imaging, lab, urine as appropriate) and exam findings with patient/family. I have reviewed nursing notes and appropriate previous records.  I feel the patient is safe to be discharged home without further emergent workup and can continue workup as an outpatient as needed. Discussed usual and customary return precautions. Patient/family verbalize understanding and are comfortable with this plan.  Outpatient follow-up has been provided if needed. All questions have been answered.         Litha Lamartina, Layla Maw, DO 07/01/17 0128

## 2017-06-30 NOTE — ED Triage Notes (Signed)
Patient to ED c/o worsening lower back pain affecting daily activities - has known L4/L5 compression and bulging discs, is seeing a chiropractor 3x/week. Patient denies new symptoms other than worsening pain, but endorses continued numbness and tingling down both legs and into feet. Patient ambulatory with slow, steady gait.

## 2017-07-01 MED ORDER — OXYCODONE-ACETAMINOPHEN 5-325 MG PO TABS: 2.0000 | ORAL_TABLET | Freq: Four times a day (QID) | ORAL | 0 refills | Status: DC | PRN

## 2017-07-01 MED ORDER — PREDNISONE 10 MG (21) PO TBPK: ORAL_TABLET | ORAL | 0 refills | Status: DC

## 2017-07-01 MED ORDER — IBUPROFEN 800 MG PO TABS: 800.0000 mg | ORAL_TABLET | Freq: Three times a day (TID) | ORAL | 0 refills | Status: DC | PRN

## 2017-07-01 MED ORDER — HYDROMORPHONE HCL 2 MG/ML IJ SOLN
1.0000 mg | Freq: Once | INTRAMUSCULAR | Status: DC
  Filled 2017-07-01: qty 1

## 2017-07-16 ENCOUNTER — Ambulatory Visit: Payer: BC Managed Care – PPO | Admitting: Adult Health

## 2017-07-16 ENCOUNTER — Encounter

## 2017-07-16 ENCOUNTER — Encounter: Payer: Self-pay | Admitting: Adult Health

## 2017-07-16 VITALS — BP 115/77 | HR 81 | Ht 66.0 in | Wt 255.2 lb

## 2017-07-16 DIAGNOSIS — I639 Cerebral infarction, unspecified: Secondary | ICD-10-CM | POA: Diagnosis not present

## 2017-07-16 DIAGNOSIS — G43609 Persistent migraine aura with cerebral infarction, not intractable, without status migrainosus: Secondary | ICD-10-CM | POA: Diagnosis not present

## 2017-07-16 MED ORDER — TOPIRAMATE 100 MG PO TABS: 100.0000 mg | ORAL_TABLET | Freq: Two times a day (BID) | ORAL | 11 refills | Status: DC

## 2017-07-16 MED ORDER — RIZATRIPTAN BENZOATE 10 MG PO TABS: ORAL_TABLET | ORAL | 11 refills | Status: DC

## 2017-07-16 NOTE — Patient Instructions (Addendum)
Your Plan:  Continue topamax and maxalt Consider back up method to birth control. Topamax with >200 mg can interfere with efficacy of birth control.  If your symptoms worsen or you develop new symptoms please let us know.    Thank you for coming to see [place] at [facility]. I hope we have been able to provide you high quality care today.  You may receive a patient satisfaction survey over the next few weeks. We would appreciate your feedback and comments so that we may continue to improve ourselves and the health of our patients.

## 2017-07-16 NOTE — Progress Notes (Signed)
I have read the note, and I agree with the clinical assessment and plan.  Brandonlee Navis K Mardene Lessig   

## 2017-07-16 NOTE — Progress Notes (Signed)
PATIENT: Carolyn Warner DOB: 06-Jul-1975  REASON FOR VISIT: follow up HISTORY FROM: patient  HISTORY OF PRESENT ILLNESS: Today 07/16/17 Carolyn Warner is a 42 year old female with a history of migraine.  She returns today for follow-up.  At last visit we increased topamax to  daily.  She reports that she has not had a headache in the last 2 months.  She reports that she has not had to use the Maxalt.  The patient does have an IUD.Marland Kitchen  She states that she does have bulging disc in the lower back and going for MRI today.  She reports back pain but this is being followed by her orthopedist.  She denies any new neurological symptoms.  She returns today for follow-up.  HISTORY  12/23/16 Carolyn Warner is a 42 year old female with a history of migraine headaches. She returns today for follow-up. She continues on Topamax 100 mg twice a day. She reports that she has approximately 2 migraines a month. She states that Maxalt has offered her some benefit however with severe migraines it does not resolve the headache. She reports that her headaches typically occur behind both eyes. She does have some blurred vision as well as photophobia with the headaches. She states in September with her migraines she did experience nausea and vomiting. She reports typically her migraines will last approximately 24 hours. She states that she currently has a headache. Reports that it started Saturday and has lingered till today. She reports that she has found that her contacts as well as sleep deprivation are a trigger for her headaches. She is currently working night shift but is considering requesting day shift if she continues to have headaches. She returns today for an evaluation.   REVIEW OF SYSTEMS: Out of a complete 14 system review of symptoms, the patient complains only of the following symptoms, and all other reviewed systems are negative.  See HPI  ALLERGIES: No Known Allergies  HOME MEDICATIONS: Outpatient  Medications Prior to Visit  Medication Sig Dispense Refill  . buPROPion (WELLBUTRIN XL) 300 MG 24 hr tablet Take 1 tablet by mouth daily.    . celecoxib (CELEBREX) 200 MG capsule 200 mg.  2  . diazepam (VALIUM) 5 MG tablet 5 mg. For MRI today    . ibuprofen (ADVIL,MOTRIN) 800 MG tablet Take 1 tablet (800 mg total) by mouth every 8 (eight) hours as needed for mild pain. 30 tablet 0  . levonorgestrel (MIRENA) 20 MCG/24HR IUD by Intrauterine route.    . rizatriptan (MAXALT) 10 MG tablet Take 1 tablet at the onset of migraine. May repeat in 2 hours if needed 10 tablet 5  . sertraline (ZOLOFT) 50 MG tablet Take 50 mg by mouth daily.    Marland Kitchen SYNTHROID 200 MCG tablet 200 mcg daily.  5  . tobramycin-dexamethasone (TOBRADEX) ophthalmic solution As needed, allergies  0  . topiramate (TOPAMAX) 100 MG tablet Take 1 tablet (100 mg total) by mouth 2 (two) times daily. 60 tablet 11  . predniSONE (STERAPRED UNI-PAK 21 TAB) 10 MG (21) TBPK tablet Take as directed 21 tablet 0  . SYNTHROID 175 MCG tablet 175 mcg daily.  11  . oxyCODONE-acetaminophen (PERCOCET/ROXICET) 5-325 MG tablet Take 2 tablets by mouth every 6 (six) hours as needed. (Patient not taking: Reported on 07/16/2017) 20 tablet 0   No facility-administered medications prior to visit.     PAST MEDICAL HISTORY: Past Medical History:  Diagnosis Date  . Anxiety   . Common migraine with intractable migraine  05/31/2016  . Depression     PAST SURGICAL HISTORY: Past Surgical History:  Procedure Laterality Date  . NO PAST SURGERIES      FAMILY HISTORY: Family History  Problem Relation Age of Onset  . Thyroid disease Mother   . Alzheimer's disease Maternal Grandmother   . Stroke Paternal Grandmother     SOCIAL HISTORY: Social History   Socioeconomic History  . Marital status: Legally Separated    Spouse name: Not on file  . Number of children: 3  . Years of education: College  . Highest education level: Not on file  Occupational History   . Occupation: [employer]eds  . Financial resource strain: Not on file  . Food insecurity:    Worry: Not on file    Inability: Not on file  . Transportation needs:    Medical: Not on file    Non-medical: Not on file  Tobacco Use  . Smoking status: Never Smoker  . Smokeless tobacco: Never Used  Substance and Sexual Activity  . Alcohol use: No  . Drug use: No  . Sexual activity: Not on file  Lifestyle  . Physical activity:    Days per week: Not on file    Minutes per session: Not on file  . Stress: Not on file  Relationships  . Social connections:    Talks on phone: Not on file    Gets together: Not on file    Attends religious service: Not on file    Active member of club or organization: Not on file    Attends meetings of clubs or organizations: Not on file    Relationship status: Not on file  . Intimate partner violence:    Fear of current or ex partner: Not on file    Emotionally abused: Not on file    Physically abused: Not on file    Forced sexual activity: Not on file  Other Topics Concern  . Not on file  Social History Narrative   Lives with 3 kids   Caffeine use: occasional (coffee)   Tea-half and half- once per week   Right-handed      PHYSICAL EXAM  Vitals:   07/16/17 0718  BP: 115/77  Pulse: 81  Weight: 255 lb 3.2 oz (115.8 kg)  Height:  (1.676 m)   Body mass index is 41.19 kg/m.  Generalized: Well developed, in no acute distress   Neurological examination  Mentation: Alert oriented to time, place, history taking. Follows all commands speech and language fluent Cranial nerve II-XII: Pupils were equal round reactive to light. Extraocular movements were full, visual field were full on confrontational test. Facial sensation and strength were normal. Uvula tongue midline. Head turning and shoulder shrug  were normal and symmetric. Motor: The motor testing reveals 5 over 5 strength of all 4 extremities. Good symmetric  motor tone is noted throughout.  Sensory: Sensory testing is intact to soft touch on all 4 extremities. No evidence of extinction is noted.  Coordination: Cerebellar testing reveals good finger-nose-finger and heel-to-shin bilaterally.  Gait and station: Gait is normal. Tandem gait is normal. Romberg is negative. No drift is seen.  Reflexes: Deep tendon reflexes are symmetric and normal bilaterally.   DIAGNOSTIC DATA (LABS, IMAGING, TESTING) - I reviewed patient records, labs, notes, testing and imaging myself where available.  Lab Results  Component Value Date   WBC 6.8 06/30/2017   HGB 14.7 06/30/2017   HCT 43.5 06/30/2017  MCV 91.2 06/30/2017   PLT 276 06/30/2017      Component Value Date/Time   NA 140 06/30/2017 1720   K 3.7 06/30/2017 1720   CL 110 06/30/2017 1720   CO2 22 06/30/2017 1720   GLUCOSE 86 06/30/2017 1720   BUN 13 06/30/2017 1720   CREATININE 1.21 (H) 06/30/2017 1720   CALCIUM 9.1 06/30/2017 1720   GFRNONAA 55 (L) 06/30/2017 1720   GFRAA >60 06/30/2017 1720      ASSESSMENT AND PLAN 42 y.o. year old female  has a past medical history of Anxiety, Common migraine with intractable migraine (05/31/2016), and Depression. here with:  1.  Migraine headache  Overall the patient is doing well.  She will continue on Topamax 200 mg daily as well as Maxalt as needed.  I did advise the patient that with doses of Topamax greater than 200 this may interfere with the efficacy of her birth control.  A back-up method may be warranted.  She voiced understanding.  I have advised that if her headache worsen or she develops new symptoms she should let us know.  She will follow-up in 6 months or sooner if needed.   I spent 15 minutes with the patient. 50% of this time was spent discussing medication interaction   Butch Penny, MSN, NP-C 07/16/2017, 7:36 AM [facility] [address] [place], [place] [address] [phone]

## 2018-01-19 ENCOUNTER — Encounter: Payer: Self-pay | Admitting: Adult Health

## 2018-01-19 ENCOUNTER — Ambulatory Visit: Payer: BC Managed Care – PPO | Admitting: Adult Health

## 2018-03-13 ENCOUNTER — Encounter: Payer: Self-pay | Admitting: Nurse Practitioner

## 2018-03-13 ENCOUNTER — Other Ambulatory Visit: Payer: Self-pay

## 2018-03-13 ENCOUNTER — Ambulatory Visit (INDEPENDENT_AMBULATORY_CARE_PROVIDER_SITE_OTHER): Payer: BC Managed Care – PPO | Admitting: Nurse Practitioner

## 2018-03-13 VITALS — BP 121/70 | HR 64 | Temp 98.0°F | Ht 66.0 in | Wt 268.2 lb

## 2018-03-13 DIAGNOSIS — I872 Venous insufficiency (chronic) (peripheral): Secondary | ICD-10-CM | POA: Diagnosis not present

## 2018-03-13 DIAGNOSIS — R609 Edema, unspecified: Secondary | ICD-10-CM

## 2018-03-13 DIAGNOSIS — E039 Hypothyroidism, unspecified: Secondary | ICD-10-CM

## 2018-03-13 DIAGNOSIS — Z7689 Persons encountering health services in other specified circumstances: Secondary | ICD-10-CM | POA: Diagnosis not present

## 2018-03-13 DIAGNOSIS — R600 Localized edema: Secondary | ICD-10-CM

## 2018-03-13 NOTE — Patient Instructions (Addendum)
Manfred ArchKelly Wyke,   Thank you for coming in to clinic today.  1. START wearing compression socks daily when working. - choose 15-22 mmHg compression strength.  If swelling at knee when wearing, may need thigh high socks.  [facility] [employer] [place], [place] [address]  [phone]  [facility] [address] [place], [place] [address] [phone]  2. Labs in the next 7 days 8-11:45 M-F in clinic.  Please schedule a follow-up appointment with Wilhelmina McardleLauren Maxcine Strong, AGNP. Return in about 2 months (around 05/12/2018) for annual physical.  If you have any other questions or concerns, please feel free to call the clinic or send a message through MyChart. You may also schedule an earlier appointment if necessary.  You will receive a survey after today's visit either digitally by e-mail or paper by [employer] mail. Your experiences and feedback matter to us.  Please respond so we know how we are doing as we provide care for you.   Wilhelmina McardleLauren Carlton Sweaney, DNP, AGNP-BC Adult Gerontology Nurse Practitioner [facility], [facility]

## 2018-03-13 NOTE — Progress Notes (Signed)
Subjective:    Patient ID: Carolyn Warner, female    DOB: [birth year], 43 y.o.   MRN: [id number]  Carolyn Warner is a 43 y.o. female presenting on 03/13/2018 for Establish Care (bilateral swelling feet x 2 weeks. Pt states she sit for 12 hoursat work.  )   HPI Establish Care New Provider Pt last seen by PCP 5 years ago.  Obtain records from Care Everywhere for most recent preventative care provided at [facility].    Bilateral leg/feet swelling Patient notes increased swelling x 2 weeks in bilateral feet.  Sits x 12 hour shifts at work 6 days per week. 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 (36-48) Also works [employer] (occasionally 8 hrs) (12) Morbidly obese with weight generally stable over last several years.  She does not over last several months, has been increasing.  Patient notes she has been chronically morbidly obese from very young age. - +3-+4 edema typically at end of day - patient brings pictures to show today - Drinks only water. - Rarely has opportunity to exercise, stand.  Does not get traditional lunch break when working at [employer], so cannot use this for standing/walking.  Hypothyroidism Patient has been managed in past by endocrinology for hypothyroidism.  Continues with 200 mcg onc daily dosing.  Migraine Patient generally has about 10 days per month, takes rizatriptan 10 mg when needed for severe migraine.  Depression and Anxiety Patient is managed on sertraline 50 mg once daily. Generally feels well and has no concerns about this today.  Past Medical History:  Diagnosis Date  . Anxiety   . 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 Common migraine with intractable migraine 05/31/2016  . Depression   . Headache   . Thyroid disease    Past Surgical History:  Procedure Laterality Date  . NO PAST SURGERIES     Social History   Socioeconomic History  . Marital status: Married    Spouse name: Not on file  . Number of children: 3  . Years of education:  College  . Highest education level: Associate degree: occupational, Scientist, product/process development, or vocational program  Occupational History  . Occupation: [employer]  . Financial resource strain: Not hard at all  . Food insecurity:    Worry: Never true    Inability: Never true  . Transportation needs:    Medical: No    Non-medical: No  Tobacco Use  . Smoking status: Never Smoker  . Smokeless tobacco: Never Used  Substance and Sexual Activity  . Alcohol use: No  . Drug use: No  . Sexual activity: Yes    Birth control/protection: I.U.D.  Lifestyle  . Physical activity:    Days per week: 2 days    Minutes per session: 10 min  . Stress: Not on file  Relationships  . Social connections:    Talks on phone: Not on file    Gets together: Not on file    Attends religious service: Not on file    Active member of club or organization: Not on file    Attends meetings of clubs or organizations: Not on file    Relationship status: Not on file  . Intimate partner violence:    Fear of current or ex partner: No    Emotionally abused: No    Physically abused: No    Forced sexual activity: No  Other Topics Concern  . Not on file  Social History Narrative  Lives with 3 kids   Caffeine use: occasional (coffee)   Tea-half and half- once per week   Right-handed   Family History  Problem Relation Age of Onset  . Thyroid disease Mother   . Healthy Father   . Alzheimer's disease Maternal Grandmother   . Thyroid cancer Maternal Grandmother   . Stroke Paternal Grandmother   . Thyroid disease Son   . Healthy Maternal Grandfather    Current Outpatient Medications on File Prior to Visit  Medication Sig  . levonorgestrel (MIRENA) 20 MCG/24HR IUD by Intrauterine route.  . rizatriptan (MAXALT) 10 MG tablet Take 1 tablet at the onset of migraine. May repeat in 2 hours if needed  . sertraline (ZOLOFT) 50 MG tablet Take 50 mg by mouth daily.  Marland Kitchen. SYNTHROID 200 MCG tablet 200 mcg daily.    No current facility-administered medications on file prior to visit.     Review of Systems  Constitutional: Negative for chills and fever.  HENT: Negative for congestion and sore throat.   Eyes: Negative for pain.  Respiratory: Negative for cough, shortness of breath and wheezing.   Cardiovascular: Positive for leg swelling. Negative for chest pain and palpitations.  Gastrointestinal: Negative for abdominal pain, blood in stool, constipation, diarrhea, nausea and vomiting.  Endocrine: Negative for polydipsia.  Genitourinary: Negative for dysuria, frequency, hematuria and urgency.  Musculoskeletal: Negative for back pain, myalgias and neck pain.  Skin: Negative.  Negative for rash.  Allergic/Immunologic: Negative for environmental allergies.  Neurological: Positive for headaches. Negative for dizziness and weakness.  Hematological: Does not bruise/bleed easily.  Psychiatric/Behavioral: Negative for dysphoric mood and suicidal ideas. The patient is not nervous/anxious.    Per HPI unless specifically indicated above     Objective:    BP 121/70 (BP Location: Left Arm, Patient Position: Sitting, Cuff Size: Large)   Pulse 64   Temp 98 F (36.7 C) (Oral)   Ht 5\' 6"  (1.676 m)   Wt 268 lb 3.2 oz (121.7 kg)   BMI 43.29 kg/m   Wt Readings from Last 3 Encounters:  03/13/18 268 lb 3.2 oz (121.7 kg)  07/16/17 255 lb 3.2 oz (115.8 kg)  12/23/16 246 lb 3.2 oz (111.7 kg)    Physical Exam Vitals signs reviewed.  Constitutional:      General: She is not in acute distress.    Appearance: She is well-developed.  HENT:     Head: Normocephalic and atraumatic.  Neck:     Thyroid: No thyroid mass, thyromegaly or thyroid tenderness.  Cardiovascular:     Rate and Rhythm: Normal rate and regular rhythm.     Pulses:          Radial pulses are 2+ on the right side and 2+ on the left side.       Posterior tibial pulses are 1+ on the right side and 1+ on the left side.     Heart sounds: Normal  heart sounds, S1 normal and S2 normal.  Pulmonary:     Effort: Pulmonary effort is normal. No respiratory distress.     Breath sounds: Normal breath sounds and air entry.  Musculoskeletal:     Right lower leg: Edema (trace to +1 today) present.     Left lower leg: Edema (trace to +1 today) present.  Skin:    General: Skin is warm and dry.     Capillary Refill: Capillary refill takes less than 2 seconds.  Neurological:     Mental Status: She is alert and  oriented to person, place, and time.  Psychiatric:        Attention and Perception: Attention normal.        Mood and Affect: Mood and affect normal.        Behavior: Behavior normal. Behavior is cooperative.    Results for orders placed or performed during the hospital encounter of 06/30/17  Basic metabolic panel  Result Value Ref Range   Sodium 140 135 - 145 mmol/L   Potassium 3.7 3.5 - 5.1 mmol/L   Chloride 110 101 - 111 mmol/L   CO2 22 22 - 32 mmol/L   Glucose, Bld 86 65 - 99 mg/dL   BUN 13 6 - 20 mg/dL   Creatinine, Ser 5.63 (H) 0.44 - 1.00 mg/dL   Calcium 9.1 8.9 - 14.9 mg/dL   GFR calc non Af Amer 55 (L) >60 mL/min   GFR calc Af Amer >60 >60 mL/min   Anion gap 8 5 - 15  CBC  Result Value Ref Range   WBC 6.8 4.0 - 10.5 K/uL   RBC 4.77 3.87 - 5.11 MIL/uL   Hemoglobin 14.7 12.0 - 15.0 g/dL   HCT 70.2 63.7 - 85.8 %   MCV 91.2 78.0 - 100.0 fL   MCH 30.8 26.0 - 34.0 pg   MCHC 33.8 30.0 - 36.0 g/dL   RDW 85.0 27.7 - 41.2 %   Platelets 276 150 - 400 K/uL  POC Urine Pregnancy, ED (do NOT order at [facility])  Result Value Ref Range   Preg Test, Ur NEGATIVE NEGATIVE      Assessment & Plan:   Problem List Items Addressed This Visit      Cardiovascular and Mediastinum   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  Complicated by morbid obesity, gradually worsening. Not currently using compression socks.  Plan: 1. START wearing knee-high  compression socks 2. START regular foot movements /pumping motions during day at least once per hour. 3. Stand and march in place if able.  Add regular walking/exercise to daily routines. 4. Encouraged patient to consider weight loss as this is large contributor to venous insufficiency, reduced venous/lymph flow when seated. 5. Consider future  Vascular referral prn.     Endocrine   Acquired hypothyroidism - Primary Status unknown.  Recheck labs.  Continue meds without changes today.  Refills provided. Followup prn after labs.    Relevant Orders   TSH   T4, free   Thyroid Peroxidase Antibodies (TPO) (REFL)    Other Visit Diagnoses    Encounter to establish care     Previous PCP was > 5 years ago.  Records from recent care are reviewed in [facility].  Past medical, family, and surgical history reviewed w/ pt.         Follow up plan: Return in about 2 months (around 05/12/2018) for annual physical.  [name], DNP, AGPCNP-BC Adult Gerontology Primary Care Nurse Practitioner [facility] 03/13/2018, 10:46 AM

## 2018-03-16 ENCOUNTER — Encounter: Payer: Self-pay | Admitting: Nurse Practitioner

## 2018-03-16 DIAGNOSIS — R609 Edema, unspecified: Secondary | ICD-10-CM

## 2018-03-16 DIAGNOSIS — E039 Hypothyroidism, unspecified: Secondary | ICD-10-CM | POA: Insufficient documentation

## 2018-03-16 DIAGNOSIS — R600 Localized edema: Secondary | ICD-10-CM | POA: Insufficient documentation

## 2018-03-16 DIAGNOSIS — I872 Venous insufficiency (chronic) (peripheral): Secondary | ICD-10-CM | POA: Insufficient documentation

## 2018-03-18 ENCOUNTER — Other Ambulatory Visit: Payer: BC Managed Care – PPO

## 2018-04-07 ENCOUNTER — Other Ambulatory Visit: Payer: Self-pay | Admitting: Nurse Practitioner

## 2018-04-07 ENCOUNTER — Telehealth: Payer: Self-pay

## 2018-04-07 DIAGNOSIS — E039 Hypothyroidism, unspecified: Secondary | ICD-10-CM

## 2018-04-07 MED ORDER — SYNTHROID 200 MCG PO TABS: 200.0000 ug | ORAL_TABLET | Freq: Every day | ORAL | 1 refills | Status: DC

## 2018-04-07 NOTE — Telephone Encounter (Signed)
The pt was notified about her TSH, T4, and Thyroid Peroxidase la result. I also contacted [facility] and they will resend the labs electronically to attempt to get it in the patients chart.

## 2018-04-09 ENCOUNTER — Other Ambulatory Visit: Payer: Self-pay

## 2018-04-09 LAB — TSH: TSH: 2.27 mIU/L

## 2018-04-09 LAB — THYROID PEROXIDASE ANTIBODIES (TPO) (REFL): Thyroperoxidase Ab SerPl-aCnc: 4 IU/mL (ref <9)

## 2018-04-09 LAB — T4, FREE: Free T4: 1.2 ng/dL (ref 0.8–1.8)

## 2018-04-24 ENCOUNTER — Ambulatory Visit: Payer: Self-pay | Admitting: Nurse Practitioner

## 2018-04-29 ENCOUNTER — Ambulatory Visit: Payer: Self-pay | Admitting: Nurse Practitioner

## 2018-04-29 ENCOUNTER — Encounter: Payer: Self-pay | Admitting: Nurse Practitioner

## 2018-04-29 ENCOUNTER — Other Ambulatory Visit: Payer: Self-pay

## 2018-04-29 VITALS — BP 122/75 | HR 67 | Temp 98.1°F | Resp 16 | Ht 66.0 in | Wt 269.4 lb

## 2018-04-29 DIAGNOSIS — R5383 Other fatigue: Secondary | ICD-10-CM

## 2018-04-29 DIAGNOSIS — E039 Hypothyroidism, unspecified: Secondary | ICD-10-CM

## 2018-04-29 DIAGNOSIS — M5441 Lumbago with sciatica, right side: Secondary | ICD-10-CM

## 2018-04-29 DIAGNOSIS — G8929 Other chronic pain: Secondary | ICD-10-CM

## 2018-04-29 DIAGNOSIS — M5442 Lumbago with sciatica, left side: Secondary | ICD-10-CM

## 2018-04-29 MED ORDER — BACLOFEN 10 MG PO TABS: 5.0000 mg | ORAL_TABLET | Freq: Every evening | ORAL | 1 refills | Status: DC | PRN

## 2018-04-29 MED ORDER — IBUPROFEN 800 MG PO TABS: 800.0000 mg | ORAL_TABLET | Freq: Three times a day (TID) | ORAL | 2 refills | Status: DC | PRN

## 2018-04-29 MED ORDER — DULOXETINE HCL 30 MG PO CPEP: 30.0000 mg | ORAL_CAPSULE | Freq: Every day | ORAL | 2 refills | Status: DC

## 2018-04-29 NOTE — Progress Notes (Signed)
Subjective:    Patient ID: Carolyn Warner, female    DOB: [birth year], 43 y.o.   MRN: [id number]  Carolyn Warner is a 43 y.o. female presenting on 04/29/2018 for Back Pain (chronic back pain, pt recently been treated by pain management )  HPI Back Pain Patient has chronic back pain and has had recent treatment at [facility] pain management with last injection in September 2019.  She presents today due to worsening pain.  She cannot lift her leg to put pants on, unable to sleep, cannot bend to do laundry.  Constant pain.  Patient reported pain at 10/10, but is sitting comfortably in chair today without diaphoresis, shortness of breath.   - Patient is not planning to go back to [facility] pain management. - L5 bulging disc on MRI May-June 2019 at [facility] in [place]. First "shot" was July.   - Patient is taking OTC ibuprofen 800 mg 2-3 times per day, usually twice.   - Gabapentin caused significant drowsiness - Steroid injection was only helpful for 1-2 weeks at most in past.  Patient received 2 injections and had very little relief.   Morbid Obesity Patient notes she has been recommended to lose weight several times in past to help with back pain - Patient states she is unable to walk to lose weight, fad diets have not been effective.   - Patient reports having problems with weight for many years (thyroid, depression are patient reasons for difficulty losing).  Has considered gastric sleeve and desires consultation at this time.  Hypothyroidism - Pt states she is taking her levothyroxine 200 mcg in the am at least 1 hour before eating or drinking and taking other medicines.   - She is symptomatic with fatigue. - She denies, excess energy, heart racing, heart palpitations, heat and cold intolerance, changes in hair/skin/nails, and lower leg swelling.  - She does not have any compressive symptoms to include difficulty swallowing, globus sensation, or difficulty breathing when  lying flat.   Social History   Tobacco Use  . Smoking status: Never Smoker  . Smokeless tobacco: Never Used  Substance Use Topics  . Alcohol use: No  . Drug use: No    Review of Systems Per HPI unless specifically indicated above     Objective:    BP 122/75   Pulse 67   Temp 98.1 F (36.7 C) (Oral)   Resp 16   Ht 5\' 6"  (1.676 m)   Wt 269 lb 6.4 oz (122.2 kg)   BMI 43.48 kg/m   Wt Readings from Last 3 Encounters:  04/29/18 269 lb 6.4 oz (122.2 kg)  03/13/18 268 lb 3.2 oz (121.7 kg)  07/16/17 255 lb 3.2 oz (115.8 kg)    Physical Exam Vitals signs reviewed.  Constitutional:      General: She is not in acute distress.    Appearance: She is well-developed.  HENT:     Head: Normocephalic and atraumatic.  Cardiovascular:     Rate and Rhythm: Normal rate and regular rhythm.     Pulses:          Radial pulses are 2+ on the right side and 2+ on the left side.       Posterior tibial pulses are 1+ on the right side and 1+ on the left side.     Heart sounds: Normal heart sounds, S1 normal and S2 normal.  Pulmonary:     Effort: Pulmonary effort is normal. No respiratory distress.  Breath sounds: Normal breath sounds and air entry.  Musculoskeletal:     Right lower leg: No edema.     Left lower leg: No edema.  Skin:    General: Skin is warm and dry.     Capillary Refill: Capillary refill takes less than 2 seconds.  Neurological:     General: No focal deficit present.     Mental Status: She is alert and oriented to person, place, and time. Mental status is at baseline.  Psychiatric:        Attention and Perception: Attention normal.        Mood and Affect: Mood and affect normal.        Behavior: Behavior normal. Behavior is cooperative.    Results for orders placed or performed in visit on 04/29/18  TSH  Result Value Ref Range   TSH 0.61 mIU/L  VITAMIN D 25 Hydroxy (Vit-D Deficiency, Fractures)  Result Value Ref Range   Vit D, 25-Hydroxy 19 (L) 30 - 100 ng/mL   Hemoglobin A1c  Result Value Ref Range   Hgb A1c MFr Bld 5.1 <5.7 % of total Hgb   Mean Plasma Glucose 100 (calc)   eAG (mmol/L) 5.5 (calc)  Magnesium  Result Value Ref Range   Magnesium 2.0 1.5 - 2.5 mg/dL  COMPLETE METABOLIC PANEL WITH GFR  Result Value Ref Range   Glucose, Bld 97 65 - 99 mg/dL   BUN 17 7 - 25 mg/dL   Creat 1.320.84 4.400.50 - 1.021.10 mg/dL   GFR, Est Non African American 86 > OR = 60 mL/min/1.9673m2   GFR, Est African American 100 > OR = 60 mL/min/1.6273m2   BUN/Creatinine Ratio NOT APPLICABLE 6 - 22 (calc)   Sodium 138 135 - 146 mmol/L   Potassium 4.7 3.5 - 5.3 mmol/L   Chloride 104 98 - 110 mmol/L   CO2 24 20 - 32 mmol/L   Calcium 9.4 8.6 - 10.2 mg/dL   Total Protein 7.1 6.1 - 8.1 g/dL   Albumin 4.3 3.6 - 5.1 g/dL   Globulin 2.8 1.9 - 3.7 g/dL (calc)   AG Ratio 1.5 1.0 - 2.5 (calc)   Total Bilirubin 0.4 0.2 - 1.2 mg/dL   Alkaline phosphatase (APISO) 48 31 - 125 U/L   AST 32 (H) 10 - 30 U/L   ALT 57 (H) 6 - 29 U/L      Assessment & Plan:   Problem List Items Addressed This Visit      Endocrine   Acquired hypothyroidism Stable, but w some mild symptoms of fatigue, weight gain today on exam.  Medications tolerated without side effects.  Continue at current doses.  Refill after labs.  Check labs today. Followup 6 months.    Relevant Orders   TSH (Completed)    Other Visit Diagnoses    Fatigue, unspecified type    -  Primary Patient with fatigue today.  Possibly associated with hypothyroidism.  Complete other labs to verify.  Likely component of mild depression.    Morbid obesity (HCC)     Patient previously not successful with diet/exercise plan.  Limited activity due to back pain.  Desires to discuss weight loss surgery. - Labs today to ID contributors to obesity - Referral bariatric surgery clinic - Encouraged patient to resume healthy lifestyle, regular activity as tolerated. - Follow-up prn.   Relevant Orders   TSH (Completed)   VITAMIN D 25 Hydroxy  (Vit-D Deficiency, Fractures) (Completed)   Hemoglobin A1c (Completed)   Magnesium (  Completed)   COMPLETE METABOLIC PANEL WITH GFR (Completed)   Amb Referral to Bariatric Surgery   Chronic bilateral low back pain with bilateral sciatica     Acute on chronic bilateral LBP with sciatica.  Previously not consistent with followup at pain management due to not wanting medications offered.  Last imaging was Lumbar MRI at [facility] radiology, but images not available today.  Plan: 1. Treat with OTC pain meds (acetaminophen and ibuprofen).  Discussed alternate dosing and max dosing. 2. Apply heat and/or ice to affected area. 3. May also apply a muscle rub with lidocaine or lidocaine patch after heat or ice. 4. Take muscle relaxer baclofen 10 up to three times daily.  Cautioned drowsiness.  Patient did not like Flexeril in past. 5. START duloxetine 30 mg once daily for mild depression and chronic pain.  Can increase dose prn. 6. Referral to Pain management clinic to consider procedures.  Discussed that steroid injections are only one option and our local clinic will consider nerve blocks, radiofrequency ablation of nerves in future after taking steps to determine if patient is candidate. 7. Follow up 6 weeks and prn    Relevant Medications   DULoxetine (CYMBALTA) 30 MG capsule   baclofen (LIORESAL) 10 MG tablet   ibuprofen (ADVIL,MOTRIN) 800 MG tablet   Other Relevant Orders   Ambulatory referral to Pain Clinic      Meds ordered this encounter  Medications  . DULoxetine (CYMBALTA) 30 MG capsule    Sig: Take 1 capsule (30 mg total) by mouth daily.    Dispense:  30 capsule    Refill:  2    Order Specific Question:   Supervising Provider    Answer:   Smitty Cords [[id number]]  . baclofen (LIORESAL) 10 MG tablet    Sig: Take 0.5-1 tablets (5-10 mg total) by mouth at bedtime as needed for muscle spasms.    Dispense:  30 each    Refill:  1    Order Specific Question:   Supervising Provider      Answer:   Smitty Cords [[id number]]  . ibuprofen (ADVIL,MOTRIN) 800 MG tablet    Sig: Take 1 tablet (800 mg total) by mouth every 8 (eight) hours as needed for mild pain.    Dispense:  75 tablet    Refill:  2    Order Specific Question:   Supervising Provider    Answer:   Smitty Cords [[id number]]    Follow up plan: Return in about 6 weeks (around 06/10/2018) for anxiety, depression.  [name], DNP, AGPCNP-BC Adult Gerontology Primary Care Nurse Practitioner [facility] 04/29/2018, 8:16 AM

## 2018-04-29 NOTE — Patient Instructions (Addendum)
Carolyn Warner,   Thank you for coming in to clinic today.  1. Continue Zoloft for now.  May reduce with future Cymbalta changes.  2. START Cymbalta 30 mg once daily.  3. START Baclofen 1/2 - 1 pill 5-10 mg at bedtime for muscle tightness.  4. Referrals placed to Pain Management and Bariatric Surgery.  They will call you to schedule.  Please schedule a follow-up appointment with Carolyn Warner, AGNP. Return in about 6 weeks (around 06/10/2018) for anxiety, depression.  If you have any other questions or concerns, please feel free to call the clinic or send a message through MyChart. You may also schedule an earlier appointment if necessary.  You will receive a survey after today's visit either digitally by e-mail or paper by [employer]. Your experiences and feedback matter to [place].  Please respond so we know how we are doing as we provide care for you.   Carolyn Mcardle, DNP, AGNP-BC Adult Gerontology Nurse Practitioner [facility], [facility]  Low Back Pain Exercises See other page with pictures of each exercise.  Start with 1 or 2 of these exercises that you are most comfortable with. Do not do any exercises that cause you significant worsening pain. Some of these may cause some "stretching soreness" but it should go away after you stop the exercise, and get better over time. Gradually increase up to 3-4 exercises as tolerated.  Standing hamstring stretch: Place the heel of your leg on a stool about 15 inches high. Keep your knee straight. Lean forward, bending at the hips until you feel a mild stretch in the back of your thigh. Make sure you do not roll your shoulders and bend at the waist when doing this or you will stretch your lower back instead. Hold the stretch for 15 to 30 seconds. Repeat 3 times. Repeat the same stretch on your other leg.  Cat and camel: Get down on your hands and knees. Let your stomach sag, allowing your back to curve downward. Hold this position  for 5 seconds. Then arch your back and hold for 5 seconds. Do 3 sets of 10.  Quadriped Arm/Leg Raises: Get down on your hands and knees. Tighten your abdominal muscles to stiffen your spine. While keeping your abdominals tight, raise one arm and the opposite leg away from you. Hold this position for 5 seconds. Lower your arm and leg slowly and alternate sides. Do this 10 times on each side.  Pelvic tilt: Lie on your back with your knees bent and your feet flat on the floor. Tighten your abdominal muscles and push your lower back into the floor. Hold this position for 5 seconds, then relax. Do 3 sets of 10.  Partial curl: Lie on your back with your knees bent and your feet flat on the floor. Tighten your stomach muscles and flatten your back against the floor. Tuck your chin to your chest. With your hands stretched out in front of you, curl your upper body forward until your shoulders clear the floor. Hold this position for 3 seconds. Don't hold your breath. It helps to breathe out as you lift your shoulders up. Relax. Repeat 10 times. Build to 3 sets of 10. To challenge yourself, clasp your hands behind your head and keep your elbows out to the side.  Lower trunk rotation: Lie on your back with your knees bent and your feet flat on the floor. Tighten your abdominal muscles and push your lower back into the floor.  Keeping your shoulders down flat, gently rotate your legs to one side, then the other as far as you can. Repeat 10 to 20 times.  Single knee to chest stretch: Lie on your back with your legs straight out in front of you. Bring one knee up to your chest and grasp the back of your thigh. Pull your knee toward your chest, stretching your buttock muscle. Hold this position for 15 to 30 seconds and return to the starting position. Repeat 3 times on each side.  Double knee to chest: Lie on your back with your knees bent and your feet flat on the floor. Tighten your abdominal muscles and push your  lower back into the floor. Pull both knees up to your chest. Hold for 5 seconds and repeat 10 to 20 times.

## 2018-04-30 LAB — COMPLETE METABOLIC PANEL WITH GFR
AG Ratio: 1.5 (calc) (ref 1.0–2.5)
ALT: 57 U/L — ABNORMAL HIGH (ref 6–29)
AST: 32 U/L — ABNORMAL HIGH (ref 10–30)
Albumin: 4.3 g/dL (ref 3.6–5.1)
Alkaline phosphatase (APISO): 48 U/L (ref 31–125)
BUN: 17 mg/dL (ref 7–25)
CO2: 24 mmol/L (ref 20–32)
Calcium: 9.4 mg/dL (ref 8.6–10.2)
Chloride: 104 mmol/L (ref 98–110)
Creat: 0.84 mg/dL (ref 0.50–1.10)
GFR, Est African American: 100 mL/min/{1.73_m2} (ref >=60)
GFR, Est Non African American: 86 mL/min/{1.73_m2} (ref >=60)
Globulin: 2.8 g/dL (calc) (ref 1.9–3.7)
Glucose, Bld: 97 mg/dL (ref 65–99)
Potassium: 4.7 mmol/L (ref 3.5–5.3)
Sodium: 138 mmol/L (ref 135–146)
Total Bilirubin: 0.4 mg/dL (ref 0.2–1.2)
Total Protein: 7.1 g/dL (ref 6.1–8.1)

## 2018-04-30 LAB — HEMOGLOBIN A1C
Hgb A1c MFr Bld: 5.1 % of total Hgb (ref <5.7)
Mean Plasma Glucose: 100 (calc)
eAG (mmol/L): 5.5 (calc)

## 2018-04-30 LAB — MAGNESIUM: Magnesium: 2 mg/dL (ref 1.5–2.5)

## 2018-04-30 LAB — VITAMIN D 25 HYDROXY (VIT D DEFICIENCY, FRACTURES): Vit D, 25-Hydroxy: 19 ng/mL — ABNORMAL LOW (ref 30–100)

## 2018-04-30 LAB — TSH: TSH: 0.61 mIU/L

## 2018-05-02 ENCOUNTER — Encounter: Payer: Self-pay | Admitting: Nurse Practitioner

## 2018-05-28 ENCOUNTER — Other Ambulatory Visit (HOSPITAL_COMMUNITY): Payer: Self-pay | Admitting: Surgery

## 2018-05-28 ENCOUNTER — Other Ambulatory Visit: Payer: Self-pay | Admitting: Surgery

## 2018-06-04 ENCOUNTER — Ambulatory Visit
Payer: BC Managed Care – PPO | Attending: Student in an Organized Health Care Education/Training Program | Admitting: Student in an Organized Health Care Education/Training Program

## 2018-06-04 ENCOUNTER — Other Ambulatory Visit: Payer: Self-pay

## 2018-06-04 ENCOUNTER — Encounter: Payer: Self-pay | Admitting: Student in an Organized Health Care Education/Training Program

## 2018-06-04 VITALS — BP 126/81 | HR 82 | Temp 98.7°F | Resp 18 | Ht 66.0 in | Wt 270.0 lb

## 2018-06-04 DIAGNOSIS — M5441 Lumbago with sciatica, right side: Secondary | ICD-10-CM

## 2018-06-04 DIAGNOSIS — M4726 Other spondylosis with radiculopathy, lumbar region: Secondary | ICD-10-CM | POA: Diagnosis not present

## 2018-06-04 DIAGNOSIS — E039 Hypothyroidism, unspecified: Secondary | ICD-10-CM | POA: Diagnosis not present

## 2018-06-04 DIAGNOSIS — M5442 Lumbago with sciatica, left side: Secondary | ICD-10-CM

## 2018-06-04 DIAGNOSIS — G894 Chronic pain syndrome: Secondary | ICD-10-CM

## 2018-06-04 DIAGNOSIS — G8929 Other chronic pain: Secondary | ICD-10-CM

## 2018-06-04 DIAGNOSIS — Z6841 Body Mass Index (BMI) 40.0 and over, adult: Secondary | ICD-10-CM

## 2018-06-04 DIAGNOSIS — M7918 Myalgia, other site: Secondary | ICD-10-CM

## 2018-06-04 NOTE — Progress Notes (Signed)
Patient's Name: Carolyn Warner  MRN: [id number]  Referring Provider: Mikey College, *  DOB: December 19, 1975  PCP: Mikey College, NP  DOS: 06/04/2018  Note by: Gillis Santa, MD  Service setting: Ambulatory outpatient  Specialty: Interventional Pain Management  Location: [facility] (AMB) Pain Management Facility  Visit type: Initial Patient Evaluation  Patient type: New Patient   Primary Reason(s) for Visit: Encounter for initial evaluation of one or more chronic problems (new to examiner) potentially causing chronic pain, and posing a threat to normal musculoskeletal function. (Level of risk: High) CC: Back Pain (low)  HPI  Carolyn Warner is a 43 y.o. year old, female patient, who comes today to see [place] for the first time for an initial evaluation of her chronic pain. 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 Today she comes in for evaluation of her Back Pain (low)  Pain Assessment: Location: Right, Left Back Radiating: radiates down both legs in the front to knee, also has numbness and tingling in feet. Onset: More than a month ago Duration: Chronic pain Quality: Sharp, Dull, Aching, Tender, Tingling, Numbness Severity: 6 /10 (subjective, self-reported pain score)  Note: Reported level is inconsistent with clinical observations.                         When using our objective Pain Scale, levels between 6 and 10/10 are said to belong in an emergency room, as it progressively worsens from a 6/10, described as severely limiting, requiring emergency care not usually available at an outpatient pain management facility. At a 6/10 level, communication becomes difficult and requires great effort. Assistance to reach the emergency department may be required. Facial flushing and profuse sweating along with potentially dangerous increases in heart rate and blood pressure will be  evident. Effect on ADL: "I cant do anything"  Timing: Constant Modifying factors: muscle relaxers help me sleep BP: 126/81  HR: 82  Onset and Duration: Date of onset: 1 year ago Cause of pain: Unknown Severity: No change since onset Timing: Not influenced by the time of the day Aggravating Factors: Bending, Intercourse (sex), Kneeling, Lifiting, Motion, Prolonged sitting, Prolonged standing, Squatting, Stooping , Twisting, Walking, Walking uphill and Walking downhill Alleviating Factors: Stretching, Hot packs, Lying down, Medications and Warm showers or baths Associated Problems: Numbness and Tingling Quality of Pain: Aching, Constant, Exhausting, Sharp, Shooting, Stabbing, Tender, Tingling and Uncomfortable Previous Examinations or Tests: MRI scan, Orthopedic evaluation and Chiropractic evaluation Previous Treatments: Epidural steroid injections and TENS  The patient comes into the clinics today for the first time for a chronic pain management evaluation.   Patient is a pleasant 43 year old female with a history of axial low back pain that radiates down bilateral lower extremities extending to her anterior thigh stopping above her knee.  She also endorses paresthesias of bilateral feet.  This is been present for over a year.  Patient is tried physical therapy from March until July 2019 which she states was not beneficial.  She also states receiving series of 2 epidural steroid injections at [facility].  Patient is not aware of level and type of epidural steroid injection.  Interlaminar versus transforaminal.  Patient is also severely obese.  Patient has been referred for bariatric surgery for weight loss.  She is being referred from her primary care provider who is also assisting with medication management.  Patient denies  any bowel or bladder dysfunction.  She does have a history of hypothyroidism.  Not on chronic opioid therapy and not a candidate given her occupation and furthermore  do not recommend chronic opioid therapy for this patient given the context of her pain.  Historic Controlled Substance Pharmacotherapy Review   Patient works in the hospital helicopter control tower.  Historical Background Evaluation: [place] PMP: Six (6) year initial data search conducted.             [place] Department of public safety, offender search: Editor, commissioning Information) Non-contributory Risk Assessment Profile: Aberrant behavior: None observed or detected today Risk factors for fatal opioid overdose: None identified today Fatal overdose hazard ratio (HR): Calculation deferred Non-fatal overdose hazard ratio (HR): Calculation deferred Risk of opioid abuse or dependence: 0.7-3.0% with doses ? 36 MME/day and 6.1-26% with doses ? 120 MME/day. Substance use disorder (SUD) risk level: See below Personal History of Substance Abuse (SUD-Substance use disorder):  Alcohol: Negative  Illegal Drugs: Negative  Rx Drugs: Negative  ORT Risk Level calculation: Low Risk Opioid Risk Tool - 06/04/18 0821      Family History of Substance Abuse   Alcohol  Negative    Illegal Drugs  Negative    Rx Drugs  Negative      Personal History of Substance Abuse   Alcohol  Negative    Illegal Drugs  Negative    Rx Drugs  Negative      Age   Age between 74-45 years   Yes      History of Preadolescent Sexual Abuse   History of Preadolescent Sexual Abuse  Negative or Female      Psychological Disease   Psychological Disease  Negative    Depression  Positive      Total Score   Opioid Risk Tool Scoring  2    Opioid Risk Interpretation  Low Risk      ORT Scoring interpretation table:  Score <3 = Low Risk for SUD  Score between 4-7 = Moderate Risk for SUD  Score >8 = High Risk for Opioid Abuse   PHQ-2 Depression Scale:  Total score: 0  PHQ-2 Scoring interpretation table: (Score and probability of major depressive disorder)  Score 0 = No depression  Score 1 = 15.4% Probability  Score 2 = 21.1%  Probability  Score 3 = 38.4% Probability  Score 4 = 45.5% Probability  Score 5 = 56.4% Probability  Score 6 = 78.6% Probability   PHQ-9 Depression Scale:  Total score: 0  PHQ-9 Scoring interpretation table:  Score 0-4 = No depression  Score 5-9 = Mild depression  Score 10-14 = Moderate depression  Score 15-19 = Moderately severe depression  Score 20-27 = Severe depression (2.4 times higher risk of SUD and 2.89 times higher risk of overuse)   Pharmacologic Plan: Non-opioid analgesic therapy offered.            Initial impression: Carolyn Warner indicated having no interest in opioid therapy, at this point.  Not a candidate for chronic opioid therapy.  Agree with medication management with Cassell Smiles.  I believe Cymbalta to be a good idea.  Meds   Current Outpatient Medications:  .  baclofen (LIORESAL) 10 MG tablet, Take 0.5-1 tablets (5-10 mg total) by mouth at bedtime as needed for muscle spasms., Disp: 30 each, Rfl: 1 .  DULoxetine (CYMBALTA) 30 MG capsule, Take 1 capsule (30 mg total) by mouth daily., Disp: 30 capsule, Rfl: 2 .  ibuprofen (ADVIL,MOTRIN) 800  MG tablet, Take 1 tablet (800 mg total) by mouth every 8 (eight) hours as needed for mild pain., Disp: 75 tablet, Rfl: 2 .  levonorgestrel (MIRENA) 20 MCG/24HR IUD, by Intrauterine route., Disp: , Rfl:  .  rizatriptan (MAXALT) 10 MG tablet, Take 1 tablet at the onset of migraine. May repeat in 2 hours if needed, Disp: 10 tablet, Rfl: 11 .  SYNTHROID 200 MCG tablet, Take 1 tablet (200 mcg total) by mouth daily., Disp: 90 tablet, Rfl: 1 .  sertraline (ZOLOFT) 50 MG tablet, Take 50 mg by mouth daily., Disp: , Rfl:   Imaging Review   Lumbar DG (Complete) 4+V:  Results for orders placed during the hospital encounter of 06/30/17  DG Lumbar Spine Complete   Narrative CLINICAL DATA:  Shooting pain from the lower back to both legs  EXAM: LUMBAR SPINE - COMPLETE 4+ VIEW  COMPARISON:  None.  FINDINGS: Negative for fracture,  endplate erosion, or visible bone lesion. Focal L5-S1 disc narrowing. Borderline L4-5 retrolisthesis.  IUD in the central pelvis.  IMPRESSION: 1. No acute finding. 2. Focal L5-S1 disc degeneration.   Electronically Signed   By: Monte Fantasia M.D.   On: 06/30/2017 18:08    Complexity Note: Imaging results reviewed. Results shared with Carolyn Warner, using Layman's terms.                         ROS  Cardiovascular: No reported cardiovascular signs or symptoms such as High blood pressure, coronary artery disease, abnormal heart rate or rhythm, heart attack, blood thinner therapy or heart weakness and/or failure Pulmonary or Respiratory: No reported pulmonary signs or symptoms such as wheezing and difficulty taking a deep full breath (Asthma), difficulty blowing air out (Emphysema), coughing up mucus (Bronchitis), persistent dry cough, or temporary stoppage of breathing during sleep Neurological: No reported neurological signs or symptoms such as seizures, abnormal skin sensations, urinary and/or fecal incontinence, being born with an abnormal open spine and/or a tethered spinal cord Review of Past Neurological Studies: No results found for this or any previous visit. Psychological-Psychiatric: Anxiousness and Depressed Gastrointestinal: No reported gastrointestinal signs or symptoms such as vomiting or evacuating blood, reflux, heartburn, alternating episodes of diarrhea and constipation, inflamed or scarred liver, or pancreas or irrregular and/or infrequent bowel movements Genitourinary: No reported renal or genitourinary signs or symptoms such as difficulty voiding or producing urine, peeing blood, non-functioning kidney, kidney stones, difficulty emptying the bladder, difficulty controlling the flow of urine, or chronic kidney disease Hematological: No reported hematological signs or symptoms such as prolonged bleeding, low or poor functioning platelets, bruising or bleeding easily,  hereditary bleeding problems, low energy levels due to low hemoglobin or being anemic Endocrine: Slow thyroid Rheumatologic: No reported rheumatological signs and symptoms such as fatigue, joint pain, tenderness, swelling, redness, heat, stiffness, decreased range of motion, with or without associated Warner Musculoskeletal: Negative for myasthenia gravis, muscular dystrophy, multiple sclerosis or malignant hyperthermia Work History: Working full time  Allergies  Carolyn Warner has No Known Allergies.  Laboratory Chemistry  Inflammation Markers (CRP: Acute Phase) (ESR: Chronic Phase) No results found for: CRP, ESRSEDRATE, LATICACIDVEN                       Rheumatology Markers No results found for: RF, ANA, LABURIC, URICUR, LYMEIGGIGMAB, LYMEABIGMQN, HLAB27                      Renal Function Markers  Lab Results  Component Value Date   BUN 17 04/29/2018   CREATININE 0.84 58/11/9831   BCR NOT APPLICABLE 82/50/5397   GFRAA 100 04/29/2018   GFRNONAA 86 04/29/2018                             Hepatic Function Markers Lab Results  Component Value Date   AST 32 (H) 04/29/2018   ALT 57 (H) 04/29/2018                        Electrolytes Lab Results  Component Value Date   NA 138 04/29/2018   K 4.7 04/29/2018   CL 104 04/29/2018   CALCIUM 9.4 04/29/2018   MG 2.0 04/29/2018                        Neuropathy Markers Lab Results  Component Value Date   HGBA1C 5.1 04/29/2018                        CNS Tests No results found for: COLORCSF, APPEARCSF, RBCCOUNTCSF, WBCCSF, POLYSCSF, LYMPHSCSF, EOSCSF, PROTEINCSF, GLUCCSF, JCVIRUS, CSFOLI, IGGCSF, LABACHR, ACETBL                      Bone Pathology Markers Lab Results  Component Value Date   VD25OH 19 (L) 04/29/2018                         Coagulation Parameters Lab Results  Component Value Date   PLT 276 06/30/2017                        Cardiovascular Markers Lab Results  Component Value Date   HGB 14.7 06/30/2017    HCT 43.5 06/30/2017                         ID Markers No results found for: LYMEIGGIGMAB, HIV                      CA Markers No results found for: CEA, CA125, LABCA2                      Endocrine Markers Lab Results  Component Value Date   TSH 0.61 04/29/2018   FREET4 1.2 03/19/2018                        Note: Lab results reviewed.  PFSH  Drug: Carolyn Warner  reports no history of drug use. Alcohol:  reports no history of alcohol use. Tobacco:  reports that she has never smoked. She has never used smokeless tobacco. Medical:  has a past medical history of Anxiety, Bulging of intervertebral disc between L4 and L5, Common migraine with intractable migraine (05/31/2016), Depression, Headache, and Thyroid disease. Family: family history includes Alzheimer's disease in her maternal grandmother; Healthy in her father and maternal grandfather; Stroke in her paternal grandmother; Thyroid cancer in her maternal grandmother; Thyroid disease in her mother and son.  Past Surgical History:  Procedure Laterality Date  . NO PAST SURGERIES     Active Ambulatory Problems    Diagnosis Date Noted  . Common migraine with intractable migraine 05/31/2016  . Edema of both lower legs due to peripheral  venous insufficiency 03/16/2018  . Acquired hypothyroidism 03/16/2018   Resolved Ambulatory Problems    Diagnosis Date Noted  . No Resolved Ambulatory Problems   Past Medical History:  Diagnosis Date  . Anxiety   . 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 Depression   . Headache   . Thyroid disease    Constitutional Exam  General appearance: moderately obese and Well nourished, well developed, and well hydrated. In no apparent acute distress Vitals:   06/04/18 0811  BP: 126/81  Pulse: 82  Resp: 18  Temp: 98.7 F (37.1 C)  SpO2: 98%  Weight: 270 lb (122.5 kg)  Height: _0  (1.676 m)   BMI Assessment: Estimated body mass index is 43.58 kg/m as calculated from the  following:   Height as of this encounter: _1  (1.676 m).   Weight as of this encounter: 270 lb (122.5 kg).  BMI interpretation table: BMI level Category Range association with higher incidence of chronic pain  <18 kg/m2 Underweight   18.5-24.9 kg/m2 Ideal body weight   25-29.9 kg/m2 Overweight Increased incidence by 20%  30-34.9 kg/m2 Obese (Class I) Increased incidence by 68%  35-39.9 kg/m2 Severe obesity (Class II) Increased incidence by 136%  >40 kg/m2 Extreme obesity (Class III) Increased incidence by 254%   Patient's current BMI Ideal Body weight  Body mass index is 43.58 kg/m. Ideal body weight: 59.3 kg (130 lb 11.7 oz) Adjusted ideal body weight: 84.6 kg (186 lb 7 oz)   BMI Readings from Last 4 Encounters:  06/04/18 43.58 kg/m  04/29/18 43.48 kg/m  03/13/18 43.29 kg/m  07/16/17 41.19 kg/m   Wt Readings from Last 4 Encounters:  06/04/18 270 lb (122.5 kg)  04/29/18 269 lb 6.4 oz (122.2 kg)  03/13/18 268 lb 3.2 oz (121.7 kg)  07/16/17 255 lb 3.2 oz (115.8 kg)  Psych/Mental status: Alert, oriented x 3 (person, place, & time)       Eyes: PERLA Respiratory: No evidence of acute respiratory distress  Cervical Spine Area Exam  Skin & Axial Inspection: No masses, redness, edema, swelling, or associated skin lesions Alignment: Symmetrical Functional ROM: Decreased ROM, bilaterally Stability: No instability detected Muscle Tone/Strength: Functionally intact. No obvious neuro-muscular anomalies detected. Sensory (Neurological): Musculoskeletal pain pattern Palpation: No palpable anomalies              Upper Extremity (UE) Exam    Side: Right upper extremity  Side: Left upper extremity  Skin & Extremity Inspection: Skin color, temperature, and hair growth are WNL. No peripheral edema or cyanosis. No masses, redness, swelling, asymmetry, or associated skin lesions. No contractures.  Skin & Extremity Inspection: Skin color, temperature, and hair growth are WNL. No peripheral  edema or cyanosis. No masses, redness, swelling, asymmetry, or associated skin lesions. No contractures.  Functional ROM: Unrestricted ROM          Functional ROM: Unrestricted ROM          Muscle Tone/Strength: Functionally intact. No obvious neuro-muscular anomalies detected.  Muscle Tone/Strength: Functionally intact. No obvious neuro-muscular anomalies detected.  Sensory (Neurological): Unimpaired          Sensory (Neurological): Unimpaired          Palpation: No palpable anomalies              Palpation: No palpable anomalies              Provocative Test(s):  Phalen's test: deferred Tinel's test: deferred Apley's scratch test (touch  opposite shoulder):  Action 1 (Across chest): deferred Action 2 (Overhead): deferred Action 3 (LB reach): deferred   Provocative Test(s):  Phalen's test: deferred Tinel's test: deferred Apley's scratch test (touch opposite shoulder):  Action 1 (Across chest): deferred Action 2 (Overhead): deferred Action 3 (LB reach): deferred    Thoracic Spine Area Exam  Skin & Axial Inspection: No masses, redness, or swelling Alignment: Symmetrical Functional ROM: Unrestricted ROM Stability: No instability detected Muscle Tone/Strength: Functionally intact. No obvious neuro-muscular anomalies detected. Sensory (Neurological): Unimpaired Muscle strength & Tone: No palpable anomalies  Lumbar Spine Area Exam  Skin & Axial Inspection: No masses, redness, or swelling Alignment: Symmetrical Functional ROM: Decreased ROM       Stability: No instability detected Muscle Tone/Strength: Functionally intact. No obvious neuro-muscular anomalies detected. Sensory (Neurological): Dermatomal pain pattern  Provocative Tests: Hyperextension/rotation test: (+) bilaterally for facet joint pain. Lumbar quadrant test (Kemp's test): (+) bilateral for foraminal stenosis Lateral bending test: (+) due to pain. Patrick's Maneuver: (+) due to pain             FABER* test: deferred  today                   S-I anterior distraction/compression test: deferred today         S-I lateral compression test: deferred today         S-I Thigh-thrust test: deferred today         S-I Gaenslen's test: deferred today         *(Flexion, ABduction and External Rotation)  Gait & Posture Assessment  Ambulation: Unassisted Gait: Relatively normal for age and body habitus Posture: WNL   Lower Extremity Exam    Side: Right lower extremity  Side: Left lower extremity  Stability: No instability observed          Stability: No instability observed          Skin & Extremity Inspection: Skin color, temperature, and hair growth are WNL. No peripheral edema or cyanosis. No masses, redness, swelling, asymmetry, or associated skin lesions. No contractures.  Skin & Extremity Inspection: Skin color, temperature, and hair growth are WNL. No peripheral edema or cyanosis. No masses, redness, swelling, asymmetry, or associated skin lesions. No contractures.  Functional ROM: Decreased ROM for hip joint          Functional ROM: Decreased ROM for hip joint          Muscle Tone/Strength: Functionally intact. No obvious neuro-muscular anomalies detected.  Muscle Tone/Strength: Functionally intact. No obvious neuro-muscular anomalies detected.  Sensory (Neurological): Dermatomal pain pattern top of foot (L5)  Sensory (Neurological): Dermatomal pain pattern top of foot (L5)  DTR: Patellar: deferred today Achilles: deferred today Plantar: deferred today  DTR: Patellar: deferred today Achilles: deferred today Plantar: deferred today  Palpation: No palpable anomalies  Palpation: No palpable anomalies   Assessment  Primary Diagnosis & Pertinent Problem List: The primary encounter diagnosis was Chronic bilateral low back pain with bilateral sciatica. Diagnoses of Class 3 severe obesity without serious comorbidity with body mass index (BMI) of 40.0 to 44.9 in adult, unspecified obesity type ([place]),  Osteoarthritis of spine with radiculopathy, lumbar region, Acquired hypothyroidism, Chronic pain syndrome, and Musculoskeletal pain were also pertinent to this visit.  Visit Diagnosis (New problems to examiner): 1. Chronic bilateral low back pain with bilateral sciatica   2. Class 3 severe obesity without serious comorbidity with body mass index (BMI) of 40.0 to 44.9 in adult, unspecified obesity type (  [place])   3. Osteoarthritis of spine with radiculopathy, lumbar region   4. Acquired hypothyroidism   5. Chronic pain syndrome   6. Musculoskeletal pain    Patient is a pleasant 43 year old female with a history of axial low back pain that radiates down bilateral lower extremities extending to her anterior thigh stopping above her knee.  She also endorses paresthesias of bilateral feet.  This is been present for over a year.  Patient is tried physical therapy from March until July 2019 which she states was not beneficial.  She also states receiving series of 2 epidural steroid injections at [facility].  Patient is not aware of level and type of epidural steroid injection.  Interlaminar versus transforaminal.  Patient is also severely obese.  Patient has been referred for bariatric surgery for weight loss.  She is being referred from her primary care provider who is also assisting with medication management.Patient denies any bowel or bladder dysfunction.  She does have a history of hypothyroidism.    Not on chronic opioid therapy and not a candidate given her occupation and furthermore do not recommend chronic opioid therapy for this patient given the context of her pain.  I informed patient to obtain records for MRI and previous epidural injection levels  and type (transforaminal vs ESI). Fax MRI results as well as procedural records that included epidural steroid injections.  In order for me to present a treatment plan, will need MRI as well as details of epidural steroid injections.  Once those  are faxed pt instructed to call clinic to schedule a virtual appointment at which point I will present interventional treatment options  Treatment options pending MRI results and previous ESI's may include repeat lumbar epidural steroid injection, caudal ESI, lumbar facet medial branch nerve block, lumbar radiofrequency ablation.  If lumbar MRI unremarkable, consider SI joint x-ray.  Interventional management options: Ms. Soo was informed that there is no guarantee that she would be a candidate for interventional therapies. The decision will be based on the results of diagnostic studies, as well as Carolyn Warner's risk profile.  Procedure(s) under consideration:  Lumbar epidural steroid injection Lumbar facet medial branch nerve block, lumbar facet radiofrequency ablation SI block    Provider-requested follow-up: virtual visit after we receive records  Future Appointments  Date Time Provider [place]  06/24/2018  9:30 AM [facility]  06/24/2018 10:00 AM [id number]  06/24/2018 10:45 AM [facility] EKG [facility] None    Primary Care Physician: Mikey College, NP Location: [facility] Note by: Gillis Santa, M.D, Date: 06/04/2018; Time: 8:55 AM  Patient Instructions  It was nice meeting you today.  As we discussed, please obtain records from [facility].  Please have them fax your MRI results as well as your procedural records that included your epidural steroid injections.  In order for me to present a treatment plan, will need MRI as well as details of epidural steroid injections.  Once those are faxed to [place] please call the clinic to schedule a virtual appointment at which point I will present interventional treatment options.

## 2018-06-04 NOTE — Progress Notes (Signed)
Safety precautions to be maintained throughout the outpatient stay will include: orient to surroundings, keep bed in low position, maintain call bell within reach at all times, provide assistance with transfer out of bed and ambulation.  

## 2018-06-04 NOTE — Patient Instructions (Signed)
It was nice meeting you today.  As we discussed, please obtain records from [facility].  Please have them fax your MRI results as well as your procedural records that included your epidural steroid injections.  In order for me to present a treatment plan, will need MRI as well as details of epidural steroid injections.  Once those are faxed to [place] please call the clinic to schedule a virtual appointment at which point I will present interventional treatment options.

## 2018-06-17 ENCOUNTER — Other Ambulatory Visit: Payer: Self-pay

## 2018-06-17 ENCOUNTER — Ambulatory Visit (INDEPENDENT_AMBULATORY_CARE_PROVIDER_SITE_OTHER): Payer: Self-pay | Admitting: Nurse Practitioner

## 2018-06-17 ENCOUNTER — Ambulatory Visit
Admission: RE | Admit: 2018-06-17 | Discharge: 2018-06-17 | Disposition: A | Discharge status: Home / Self Care | Payer: BC Managed Care – PPO | Source: Ambulatory Visit | Attending: Nurse Practitioner | Admitting: Nurse Practitioner

## 2018-06-17 ENCOUNTER — Ambulatory Visit
Admission: RE | Admit: 2018-06-17 | Discharge: 2018-06-17 | Disposition: A | Discharge status: Home / Self Care | Payer: BC Managed Care – PPO | Attending: Nurse Practitioner | Admitting: Nurse Practitioner

## 2018-06-17 ENCOUNTER — Encounter: Payer: Self-pay | Admitting: Nurse Practitioner

## 2018-06-17 DIAGNOSIS — M5442 Lumbago with sciatica, left side: Secondary | ICD-10-CM | POA: Insufficient documentation

## 2018-06-17 DIAGNOSIS — M5441 Lumbago with sciatica, right side: Secondary | ICD-10-CM | POA: Diagnosis present

## 2018-06-17 DIAGNOSIS — G8929 Other chronic pain: Secondary | ICD-10-CM

## 2018-06-17 DIAGNOSIS — M545 Low back pain, unspecified: Secondary | ICD-10-CM

## 2018-06-17 DIAGNOSIS — F418 Other specified anxiety disorders: Secondary | ICD-10-CM

## 2018-06-17 MED ORDER — DULOXETINE HCL 40 MG PO CPEP: 40.0000 mg | ORAL_CAPSULE | Freq: Every day | ORAL | 5 refills | Status: DC

## 2018-06-17 MED ORDER — TIZANIDINE HCL 4 MG PO CAPS: 4.0000 mg | ORAL_CAPSULE | Freq: Three times a day (TID) | ORAL | 1 refills | Status: DC | PRN

## 2018-06-17 MED ORDER — HYDROXYZINE HCL 10 MG PO TABS: 10.0000 mg | ORAL_TABLET | Freq: Three times a day (TID) | ORAL | 0 refills | Status: DC | PRN

## 2018-06-17 MED ORDER — HYDROXYZINE HCL 10 MG PO TABS: 10.0000 mg | ORAL_TABLET | Freq: Three times a day (TID) | ORAL | 1 refills | Status: DC | PRN

## 2018-06-17 NOTE — Patient Instructions (Addendum)
Carolyn Warner [place],   Thank you for coming in to clinic today.  1. START increased dose of duloxetine.  Increase to 40 mg once daily for anxiety management.  May also help improve pain control.  2. For muscle relaxer: - Stop baclofen - START tizanidine 4 mg tablet.  May take 4 mg three times daily as needed for muscle tension causing low back pain.   Take 2 mg during day if needed to prevent drowsiness.  3. Xray ordered for [facility] (same building as [facility] near [address]).  This is to make sure you have no spinal fractures  4. For anxiety and sleep: - START hydroxyzine 10 mg tablet.  Take 1-2 tablets (10 - 20 mg) up to three times daily as needed for anxiety or insomnia.  If not effective to help calm anxiety, please call clinic in 2 days (Friday) to consider change in medication.  Please schedule a follow-up appointment with [name], AGNP. Return 2-14 days if symptoms worsen or fail to improve.  If you have any other questions or concerns, please feel free to call the clinic or send a message through MyChart. You may also schedule an earlier appointment if necessary.  You will receive a survey after today's visit either digitally by e-mail or paper by [employer]. Your experiences and feedback matter to [place].  Please respond so we know how we are doing as we provide care for you.   [name], DNP, AGNP-BC Adult Gerontology Nurse Practitioner [facility], [facility]
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
## 2018-06-23 ENCOUNTER — Other Ambulatory Visit: Payer: Self-pay | Admitting: Nurse Practitioner

## 2018-06-23 DIAGNOSIS — M5442 Lumbago with sciatica, left side: Principal | ICD-10-CM

## 2018-06-23 DIAGNOSIS — G8929 Other chronic pain: Secondary | ICD-10-CM

## 2018-06-23 DIAGNOSIS — M5441 Lumbago with sciatica, right side: Principal | ICD-10-CM

## 2018-06-24 ENCOUNTER — Other Ambulatory Visit: Payer: Self-pay

## 2018-06-24 ENCOUNTER — Ambulatory Visit: Payer: BC Managed Care – PPO

## 2018-06-29 ENCOUNTER — Encounter: Payer: BC Managed Care – PPO | Attending: Surgery | Admitting: Dietician

## 2018-06-29 ENCOUNTER — Other Ambulatory Visit: Payer: Self-pay

## 2018-06-29 ENCOUNTER — Encounter: Payer: Self-pay | Admitting: Dietician

## 2018-06-29 VITALS — Ht 66.0 in | Wt 268.8 lb

## 2018-06-29 DIAGNOSIS — Z713 Dietary counseling and surveillance: Secondary | ICD-10-CM
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
## 2018-08-04 ENCOUNTER — Other Ambulatory Visit: Payer: Self-pay

## 2018-08-04 ENCOUNTER — Ambulatory Visit
Admission: RE | Admit: 2018-08-04 | Discharge: 2018-08-04 | Disposition: A | Discharge status: Home / Self Care | Payer: BC Managed Care – PPO | Source: Ambulatory Visit | Attending: Surgery | Admitting: Surgery

## 2018-08-21 ENCOUNTER — Encounter: Payer: BC Managed Care – PPO | Attending: Surgery | Admitting: Dietician

## 2018-08-21 ENCOUNTER — Other Ambulatory Visit: Payer: Self-pay

## 2018-08-21 VITALS — Ht 66.0 in | Wt 271.2 lb

## 2018-08-21 DIAGNOSIS — Z6841 Body Mass Index (BMI) 40.0 and over, adult: Secondary | ICD-10-CM | POA: Diagnosis not present

## 2018-08-21 NOTE — Progress Notes (Signed)
Pre-Operative Nutrition Class:  Appt start time: 0900   End time:  1030.  Patient was seen on 08/21/18 for Pre-Operative Bariatric Surgery [facility]   Surgery date: TBD Surgery type: Sleeve Gastrectomy  [facility] weight at [facility]: 268.8lbs Weight today: 271.2lbs  InBody  BODY COMP RESULTS    BMI (kg/m^2) 43.8  Fat Mass (lbs) 139.8  Fat Free Mass (lbs) 35.1  Total Body Water (lbs) 96.3   Samples given per MNT protocol. Patient educated on appropriate usage: Celebrate Vitamins Multivitamin Lot # [id number]  Exp: 05/2019  Celebrate Vitamins Calcium Citrate Lot # [id number]  Exp: 02/2019;  Lot# [id number]  Exp: 12/2018  Celebrate Iron Soft Chews Lot# [id number] Exp: 03/2019  Renee Pain Protein Powder Lot # [id number]  Exp: 05/2019; Lot# [id number]  Exp: 05/2019;  Lot# [id number]  Exp: 05/2019  Unjury Protein Shake Lot# [id number]  Exp: 09/13/18   The following the learning objectives were met by the patient during this course:  Identify Pre-Op Dietary Goals and will begin 2 weeks pre-operatively  Identify appropriate sources of fluids and proteins   State protein recommendations and appropriate sources pre and post-operatively  Identify Post-Operative Dietary Goals and will follow for 2 weeks post-operatively  Identify appropriate multivitamin and calcium sources  Describe the need for physical activity post-operatively and will follow MD recommendations  State when to call healthcare provider regarding medication questions or post-operative complications  Handouts given during class include:  Pre-Op Bariatric Surgery Diet Handout  Protein Shake Handout  Post-Op Bariatric Surgery Nutrition Handout  BELT Program Information Flyer  Support Group Information Flyer  [facility] Bariatric Supplements Price List  Follow-Up Plan: Patient will follow-up at [place], at about 2 weeks post operatively for diet advancement per MD.

## 2018-09-12 ENCOUNTER — Other Ambulatory Visit: Payer: Self-pay | Admitting: Nurse Practitioner

## 2018-09-12 DIAGNOSIS — G8929 Other chronic pain: Secondary | ICD-10-CM

## 2018-09-12 DIAGNOSIS — M5442 Lumbago with sciatica, left side: Secondary | ICD-10-CM

## 2018-10-01 ENCOUNTER — Encounter: Payer: Self-pay | Admitting: Student in an Organized Health Care Education/Training Program
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
## 2018-10-05 ENCOUNTER — Encounter: Payer: Self-pay | Admitting: Student in an Organized Health Care Education/Training Program

## 2018-10-05 ENCOUNTER — Ambulatory Visit
Payer: BC Managed Care – PPO | Attending: Student in an Organized Health Care Education/Training Program | Admitting: Student in an Organized Health Care Education/Training Program

## 2018-10-05 ENCOUNTER — Other Ambulatory Visit: Payer: Self-pay

## 2018-10-05 DIAGNOSIS — M5416 Radiculopathy, lumbar region: Secondary | ICD-10-CM

## 2018-10-05 DIAGNOSIS — M5441 Lumbago with sciatica, right side: Secondary | ICD-10-CM

## 2018-10-05 DIAGNOSIS — M5442 Lumbago with sciatica, left side: Secondary | ICD-10-CM

## 2018-10-05 DIAGNOSIS — G8929 Other chronic pain: Secondary | ICD-10-CM

## 2018-10-05 DIAGNOSIS — M4726 Other spondylosis with radiculopathy, lumbar region: Secondary | ICD-10-CM | POA: Diagnosis not present

## 2018-10-05 DIAGNOSIS — Z6841 Body Mass Index (BMI) 40.0 and over, adult: Secondary | ICD-10-CM | POA: Insufficient documentation

## 2018-10-05 DIAGNOSIS — G894 Chronic pain syndrome: Secondary | ICD-10-CM | POA: Insufficient documentation

## 2018-10-05 NOTE — Progress Notes (Signed)
Pain Management Virtual Encounter Note - Virtual Visit via Telephone Telehealth (real-time audio visits between healthcare provider and patient).   Patient's Phone No. & Preferred Pharmacy:  [phone] (home); [phone] (mobile); (Preferred) [phone] [email or web address]  [id number] - [place], [place] - [address] [place] [id number] Phone: [phone] Fax: [phone]    Pre-screening note:  Our staff contacted Carolyn Warner and offered her an "in person", "face-to-face" appointment versus a telephone encounter. She indicated preferring the telephone encounter, at this time.   Reason for Virtual Visit: COVID-19*  Social distancing based on CDC and AMA recommendations.   I contacted Carolyn AlamoKelly Story Warner on 10/05/2018 via telephone.      I clearly identified myself as Edward JollyBilal Jerrelle Michelsen, MD. I verified that I was speaking with the correct person using two identifiers (Name: Carolyn Warner, and date of birth: 1975/07/01).  Advanced Informed Consent I sought verbal advanced consent from Carolyn Warner for virtual visit interactions. I informed Carolyn Warner of possible security and privacy concerns, risks, and limitations associated with providing "not-in-person" medical evaluation and management services. I also informed Carolyn Warner of the availability of "in-person" appointments. Finally, I informed her that there would be a charge for the virtual visit and that she could be  personally, fully or partially, financially responsible for it. Carolyn Warner expressed understanding and agreed to proceed.   Historic Elements   Carolyn Warner is a 43 y.o. year old, female patient evaluated today after her last encounter by our practice on 06/04/2018. 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 She also  has a past  surgical history that includes No past surgeries. Carolyn Warner has a current medication list which includes the following prescription(s): baclofen, duloxetine hcl, hydroxyzine, ibuprofen, levonorgestrel, rizatriptan, synthroid, and tizanidine. She  reports that she has never smoked. She has never used smokeless tobacco. She reports that she does not drink alcohol or use drugs. Carolyn Warner has No Known Allergies.   HPI  Today, she is being contacted for worsening of previously known (established) problem.  [facility] and has had worsening back and leg pain since. Was prescribed Zanaflex by PCP.  Patient previously had lumbar epidural steroid injections done at a previous pain clinic.  Last one was September 2019.  Patient states that they were moderately effective, 60% albeit for short period of time.  Upon looking at procedure notes, 5 cc of volume was injected which included 4 cc of preservative-free saline, 1 cc of Kenalog.  I discussed that we can try and increase the volume that we injected the patient's epidural space to help prolong the duration of her pain relief.  Patient states that she is being worked up for gastric bypass.  She is somewhat frustrated as she cannot get in contact with her bariatric team for them to inform her of her status.   Laboratory Chemistry Profile (12 mo)  Renal: 04/29/2018: BUN 17; BUN/Creatinine Ratio NOT APPLICABLE; Creat 0.84; GFR, Est African American 100; GFR, Est Non African American 86  Hepatic: 04/29/2018: ALT 57; AST 32 Other: 04/29/2018: Vit D, 25-Hydroxy 19 Note: Above Lab results reviewed.  Imaging  Last 90 days:  Dg Chest 2 View  Result Date: 08/04/2018 CLINICAL DATA:  Gastric sleeve surgery.  Preoperative exam. EXAM: CHEST - 2 VIEW COMPARISON:  No recent  prior. FINDINGS: Mediastinum and hilar structures normal. Heart size normal. No focal infiltrate. No pleural effusion or pneumothorax. IMPRESSION: No acute  cardiopulmonary disease. Electronically Signed   By: Maisie Fushomas  Register   On: 08/04/2018 09:24   Dg Kayleen MemosUgi W Double Cm (hd Ba)  Result Date: 08/04/2018 CLINICAL DATA:  Preop assessment for gastric sleeve procedure. EXAM: UPPER GI SERIES WITHOUT KUB TECHNIQUE: Routine upper GI series was performed with thin/high density/water soluble barium. FLUOROSCOPY TIME:  Fluoroscopy Time:  24 seconds Radiation Exposure Index (if provided by the fluoroscopic device): 9.6 mGy Number of Acquired Spot Images: 4 COMPARISON:  None. FINDINGS: Examination of the esophagus demonstrated normal esophageal motility. Normal esophageal morphology without evidence of esophagitis or ulceration. No esophageal stricture, diverticula, or mass lesion. No evidence of hiatal hernia. Mild gastroesophageal reflux. Examination of the stomach demonstrated normal rugal folds and areae gastricae. The gastric mucosa appeared unremarkable without evidence of ulceration, scarring, or mass lesion. Gastric motility and emptying was normal. Fluoroscopic examination of the duodenum demonstrates normal motility and morphology without evidence of ulceration or mass lesion. IMPRESSION: 1. Mild gastroesophageal reflux. 2. Otherwise normal upper GI. Electronically Signed   By: Elige KoHetal  Patel   On: 08/04/2018 09:28   Last Hospital Admission:  Dg Chest 2 View  Result Date: 08/04/2018 CLINICAL DATA:  Gastric sleeve surgery.  Preoperative exam. EXAM: CHEST - 2 VIEW COMPARISON:  No recent prior. FINDINGS: Mediastinum and hilar structures normal. Heart size normal. No focal infiltrate. No pleural effusion or pneumothorax. IMPRESSION: No acute cardiopulmonary disease. Electronically Signed   By: Maisie Fushomas  Register   On: 08/04/2018 09:24   Dg Kayleen MemosUgi W Double Cm (hd Ba)  Result Date: 08/04/2018 CLINICAL DATA:  Preop assessment for gastric sleeve procedure. EXAM: UPPER GI SERIES WITHOUT KUB TECHNIQUE: Routine upper GI series was performed with thin/high density/water soluble  barium. FLUOROSCOPY TIME:  Fluoroscopy Time:  24 seconds Radiation Exposure Index (if provided by the fluoroscopic device): 9.6 mGy Number of Acquired Spot Images: 4 COMPARISON:  None. FINDINGS: Examination of the esophagus demonstrated normal esophageal motility. Normal esophageal morphology without evidence of esophagitis or ulceration. No esophageal stricture, diverticula, or mass lesion. No evidence of hiatal hernia. Mild gastroesophageal reflux. Examination of the stomach demonstrated normal rugal folds and areae gastricae. The gastric mucosa appeared unremarkable without evidence of ulceration, scarring, or mass lesion. Gastric motility and emptying was normal. Fluoroscopic examination of the duodenum demonstrates normal motility and morphology without evidence of ulceration or mass lesion. IMPRESSION: 1. Mild gastroesophageal reflux. 2. Otherwise normal upper GI. Electronically Signed   By: Elige KoHetal  Patel   On: 08/04/2018 09:28   Assessment  The primary encounter diagnosis was Chronic radicular lumbar pain. Diagnoses of Chronic bilateral low back pain with bilateral sciatica, Class 3 severe obesity without serious comorbidity with body mass index (BMI) of 40.0 to 44.9 in adult, unspecified obesity type (HCC), Osteoarthritis of spine with radiculopathy, lumbar region, and Chronic pain syndrome were also pertinent to this visit.  Plan of Care  I am having Carolyn Warner maintain her levonorgestrel, rizatriptan, Synthroid, ibuprofen, DULoxetine HCl, hydrOXYzine, baclofen, and tiZANidine.  Pharmacotherapy (Medications Ordered): No orders of the defined types were placed in this encounter.  Orders:  Orders Placed This Encounter  Procedures  . Lumbar Epidural Injection    Standing Status:   Future    Standing Expiration Date:   11/05/2018    Scheduling Instructions:     Procedure: Interlaminar Lumbar Epidural Steroid injection (LESI)  Laterality: Midline     Sedation: WITHOUT      Timeframe: ASAA    Order Specific Question:   Where will this procedure be performed?    Answer:   [facility]   Follow-up plan:   Return for Procedure L4/5 ESi without sedation ASAP.    Recent Visits No visits were found meeting these conditions.  Showing recent visits within past 90 days and meeting all other requirements   Today's Visits Date Type Provider Dept  10/05/18 Office Visit Gillis Santa, MD [facility]  Showing today's visits and meeting all other requirements   Future Appointments No visits were found meeting these conditions.  Showing future appointments within next 90 days and meeting all other requirements   I discussed the assessment and treatment plan with the patient. The patient was provided an opportunity to ask questions and all were answered. The patient agreed with the plan and demonstrated an understanding of the instructions.  Patient advised to call back or seek an in-person evaluation if the symptoms or condition worsens.  Total duration of non-face-to-face encounter: 15 minutes.  Note by: Gillis Santa, MD Date: 10/05/2018; Time: 12:44 PM  Note: This dictation was prepared with Dragon dictation. Any transcriptional errors that may result from this process are unintentional.  Disclaimer:  * Given the special circumstances of the COVID-19 pandemic, the federal government has announced that the Office for Civil Rights (OCR) will exercise its enforcement discretion and will not impose penalties on physicians using telehealth in the event of noncompliance with regulatory requirements under the [place] (HIPAA) in connection with the good faith provision of telehealth during the VVOHY-07 national public health emergency. ([place])

## 2018-10-07 ENCOUNTER — Ambulatory Visit: Payer: BC Managed Care – PPO | Admitting: Student in an Organized Health Care Education/Training Program

## 2018-10-14 ENCOUNTER — Other Ambulatory Visit: Payer: Self-pay | Admitting: Student in an Organized Health Care Education/Training Program

## 2018-10-14 ENCOUNTER — Ambulatory Visit (HOSPITAL_BASED_OUTPATIENT_CLINIC_OR_DEPARTMENT_OTHER): Payer: BC Managed Care – PPO | Admitting: Student in an Organized Health Care Education/Training Program

## 2018-10-14 ENCOUNTER — Ambulatory Visit
Admission: RE | Admit: 2018-10-14 | Discharge: 2018-10-14 | Disposition: A | Discharge status: Home / Self Care | Payer: BC Managed Care – PPO | Source: Ambulatory Visit | Attending: Student in an Organized Health Care Education/Training Program | Admitting: Student in an Organized Health Care Education/Training Program

## 2018-10-14 ENCOUNTER — Other Ambulatory Visit: Payer: Self-pay

## 2018-10-14 ENCOUNTER — Encounter: Payer: Self-pay | Admitting: Student in an Organized Health Care Education/Training Program

## 2018-10-14 VITALS — BP 114/70 | HR 90 | Temp 98.5°F | Resp 15 | Ht 66.0 in | Wt 270.0 lb

## 2018-10-14 DIAGNOSIS — G8929 Other chronic pain: Secondary | ICD-10-CM

## 2018-10-14 DIAGNOSIS — M5416 Radiculopathy, lumbar region: Secondary | ICD-10-CM | POA: Diagnosis present

## 2018-10-14 DIAGNOSIS — R52 Pain, unspecified: Secondary | ICD-10-CM

## 2018-10-14 MED ORDER — LIDOCAINE HCL 2 % IJ SOLN
INTRAMUSCULAR | Status: AC
  Filled 2018-10-14: qty 20

## 2018-10-14 MED ORDER — SODIUM CHLORIDE (PF) 0.9 % IJ SOLN
INTRAMUSCULAR | Status: AC
  Filled 2018-10-14: qty 10

## 2018-10-14 MED ORDER — IOHEXOL 180 MG/ML  SOLN: 10.0000 mL | Freq: Once | INTRAMUSCULAR | Status: DC

## 2018-10-14 MED ORDER — DEXAMETHASONE SODIUM PHOSPHATE 10 MG/ML IJ SOLN
INTRAMUSCULAR | Status: AC
  Filled 2018-10-14: qty 1

## 2018-10-14 MED ORDER — LIDOCAINE HCL 2 % IJ SOLN
20.0000 mL | Freq: Once | INTRAMUSCULAR | Status: AC
  Administered 2018-10-14: 400 mg

## 2018-10-14 MED ORDER — DEXAMETHASONE SODIUM PHOSPHATE 10 MG/ML IJ SOLN
10.0000 mg | Freq: Once | INTRAMUSCULAR | Status: AC
  Administered 2018-10-14: 10 mg

## 2018-10-14 MED ORDER — ROPIVACAINE HCL 2 MG/ML IJ SOLN: 1.0000 mL | Freq: Once | INTRAMUSCULAR | Status: DC

## 2018-10-14 MED ORDER — ROPIVACAINE HCL 2 MG/ML IJ SOLN
INTRAMUSCULAR | Status: AC
  Filled 2018-10-14: qty 10

## 2018-10-14 MED ORDER — SODIUM CHLORIDE 0.9% FLUSH
1.0000 mL | Freq: Once | INTRAVENOUS | Status: AC
  Administered 2018-10-14: 10 mL

## 2018-10-14 NOTE — Patient Instructions (Signed)

## 2018-10-14 NOTE — Progress Notes (Signed)
Patient's Name: Carolyn Warner  MRN: [id number]  Referring Provider: Mikey College, *  DOB: [birth year]  PCP: Mikey College, NP  DOS: 10/14/2018  Note by: Gillis Santa, MD  Service setting: Ambulatory outpatient  Specialty: Interventional Pain Management  Patient type: Established  Location: [facility] (AMB) [facility] Facility  Visit type: Interventional Procedure   Primary Reason for Visit: Interventional Pain Management Treatment. CC: Back Pain (low)  Procedure:          Anesthesia, Analgesia, Anxiolysis:  Type: Diagnostic Inter-Laminar Epidural Steroid Injection  #1  Region: Lumbar Level: L4-5 Level. Laterality: Midline         Type: Local Anesthesia   Position: Prone with head of the table was raised to facilitate breathing.   Indications: 1. Chronic radicular lumbar pain    Pain Score: Pre-procedure: 7 /10 Post-procedure: 0-No pain/10   Pre-op Assessment:  Ms. Carolyn Warner is a 43 y.o. (year old), female patient, seen today for interventional treatment. She  has a past surgical history that includes No past surgeries. Ms. Carolyn Warner has a current medication list which includes the following prescription(s): baclofen, duloxetine hcl, hydroxyzine, ibuprofen, levonorgestrel, rizatriptan, synthroid, and tizanidine, and the following Facility-Administered Medications: iohexol and ropivacaine (pf) 2 mg/ml (0.2%). Her primarily concern today is the Back Pain (low)  Initial Vital Signs:  Pulse/HCG Rate: 90ECG Heart Rate: 77 Temp: 98.5 F (36.9 C) Resp: 16 BP: 128/63 SpO2: 100 %  BMI: Estimated body mass index is 43.58 kg/Carolyn as calculated from the following:   Height as of this encounter: 5\' 6"  (1.676 Carolyn).   Weight as of this encounter: 270 lb (122.5 kg).  Risk Assessment: Allergies: Reviewed. She has No Known Allergies.  Allergy Precautions: None required Coagulopathies: Reviewed. None identified.  Blood-thinner therapy: None at this time Active Infection(s):  Reviewed. None identified. Ms. Carolyn Warner is afebrile  Site Confirmation: Ms. Carolyn Warner was asked to confirm the procedure and laterality before marking the site Procedure checklist: Completed Consent: Before the procedure and under the influence of no sedative(s), amnesic(s), or anxiolytics, the patient was informed of the treatment options, risks and possible complications. To fulfill our ethical and legal obligations, as recommended by the American Medical Association's Code of Ethics, I have informed the patient of my clinical impression; the nature and purpose of the treatment or procedure; the risks, benefits, and possible complications of the intervention; the alternatives, including doing nothing; the risk(s) and benefit(s) of the alternative treatment(s) or procedure(s); and the risk(s) and benefit(s) of doing nothing. The patient was provided information about the general risks and possible complications associated with the procedure. These may include, but are not limited to: failure to achieve desired goals, infection, bleeding, organ or nerve damage, allergic reactions, paralysis, and death. In addition, the patient was informed of those risks and complications associated to Spine-related procedures, such as failure to decrease pain; infection (i.e.: Meningitis, epidural or intraspinal abscess); bleeding (i.e.: epidural hematoma, subarachnoid hemorrhage, or any other type of intraspinal or peri-dural bleeding); organ or nerve damage (i.e.: Any type of peripheral nerve, nerve root, or spinal cord injury) with subsequent damage to sensory, motor, and/or autonomic systems, resulting in permanent pain, numbness, and/or weakness of one or several areas of the body; allergic reactions; (i.e.: anaphylactic reaction); and/or death. Furthermore, the patient was informed of those risks and complications associated with the medications. These include, but are not limited to: allergic reactions (i.e.:  anaphylactic or anaphylactoid reaction(s)); adrenal axis suppression; blood sugar elevation that in diabetics may  result in ketoacidosis or comma; water retention that in patients with history of congestive heart failure may result in shortness of breath, pulmonary edema, and decompensation with resultant heart failure; weight gain; swelling or edema; medication-induced neural toxicity; particulate matter embolism and blood vessel occlusion with resultant organ, and/or nervous system infarction; and/or aseptic necrosis of one or more joints. Finally, the patient was informed that Medicine is not an exact science; therefore, there is also the possibility of unforeseen or unpredictable risks and/or possible complications that may result in a catastrophic outcome. The patient indicated having understood very clearly. We have given the patient no guarantees and we have made no promises. Enough time was given to the patient to ask questions, all of which were answered to the patient's satisfaction. Ms. Tereasa Warner has indicated that she wanted to continue with the procedure. Attestation: I, the ordering provider, attest that I have discussed with the patient the benefits, risks, side-effects, alternatives, likelihood of achieving goals, and potential problems during recovery for the procedure that I have provided informed consent. Date  Time: 10/14/2018 10:07 AM  Pre-Procedure Preparation:  Monitoring: As per clinic protocol. Respiration, ETCO2, SpO2, BP, heart rate and rhythm monitor placed and checked for adequate function Safety Precautions: Patient was assessed for positional comfort and pressure points before starting the procedure. Time-out: I initiated and conducted the "Time-out" before starting the procedure, as per protocol. The patient was asked to participate by confirming the accuracy of the "Time Out" information. Verification of the correct person, site, and procedure were performed and confirmed by  me, the nursing staff, and the patient. "Time-out" conducted as per Joint Commission's Universal Protocol (UP.01.01.01). Time: 1105  Description of Procedure:          Target Area: The interlaminar space, initially targeting the lower laminar border of the superior vertebral body. Approach: Paramedial approach. Area Prepped: Entire Posterior Lumbar Region Prepping solution: DuraPrep (Iodine Povacrylex [0.7% available iodine] and Isopropyl Alcohol, 74% w/w) Safety Precautions: Aspiration looking for blood return was conducted prior to all injections. At no point did we inject any substances, as a needle was being advanced. No attempts were made at seeking any paresthesias. Safe injection practices and needle disposal techniques used. Medications properly checked for expiration dates. SDV (single dose vial) medications used. Description of the Procedure: Protocol guidelines were followed. The procedure needle was introduced through the skin, ipsilateral to the reported pain, and advanced to the target area. Bone was contacted and the needle walked caudad, until the lamina was cleared. The epidural space was identified using "loss-of-resistance technique" with 2-3 ml of PF-NaCl (0.9% NSS), in a 5cc LOR glass syringe.  Vitals:   10/14/18 1012 10/14/18 1106 10/14/18 1111  BP: 128/63 (!) 142/89 114/70  Pulse: 90    Resp: 16 17 15   Temp: 98.5 F (36.9 C)    TempSrc: Oral    SpO2: 100% 97% 99%  Weight: 270 lb (122.5 kg)    Height: 5\' 6"  (1.676 Carolyn)      Start Time: 1106 hrs. End Time: 1110 hrs.  Materials:  Needle(s) Type: Epidural needle Gauge: 17G Length: 5-in Medication(s): Please see orders for medications and dosing details. 8 cc solution made of 5 cc of preservative-free saline, 2 cc of 0.2% ropivacaine, 1 cc of Decadron 10 mg/cc.  Imaging Guidance (Spinal):          Type of Imaging Technique: Fluoroscopy Guidance (Spinal) Indication(s): Assistance in needle guidance and placement for  procedures requiring needle placement in or  near specific anatomical locations not easily accessible without such assistance. Exposure Time: Please see nurses notes. Contrast: Before injecting any contrast, we confirmed that the patient did not have an allergy to iodine, shellfish, or radiological contrast. Once satisfactory needle placement was completed at the desired level, radiological contrast was injected. Contrast injected under live fluoroscopy. No contrast complications. See chart for type and volume of contrast used. Fluoroscopic Guidance: I was personally present during the use of fluoroscopy. "Tunnel Vision Technique" used to obtain the best possible view of the target area. Parallax error corrected before commencing the procedure. "Direction-depth-direction" technique used to introduce the needle under continuous pulsed fluoroscopy. Once target was reached, antero-posterior, oblique, and lateral fluoroscopic projection used confirm needle placement in all planes. Images permanently stored in EMR. Interpretation: I personally interpreted the imaging intraoperatively. Adequate needle placement confirmed in multiple planes. Appropriate spread of contrast into desired area was observed. No evidence of afferent or efferent intravascular uptake. No intrathecal or subarachnoid spread observed. Permanent images saved into the patient's record.  Antibiotic Prophylaxis:   Anti-infectives (From admission, onward)   None     Indication(s): None identified  Post-operative Assessment:  Post-procedure Vital Signs:  Pulse/HCG Rate: 9084 Temp: 98.5 F (36.9 C) Resp: 15 BP: 114/70 SpO2: 99 %  EBL: None  Complications: No immediate post-treatment complications observed by team, or reported by patient.  Note: The patient tolerated the entire procedure well. A repeat set of vitals were taken after the procedure and the patient was kept under observation following institutional policy, for this  type of procedure. Post-procedural neurological assessment was performed, showing return to baseline, prior to discharge. The patient was provided with post-procedure discharge instructions, including a section on how to identify potential problems. Should any problems arise concerning this procedure, the patient was given instructions to immediately contact us, at any time, without hesitation. In any case, we plan to contact the patient by telephone for a follow-up status report regarding this interventional procedure.  Comments:  No additional relevant information. 5 out of 5 strength bilateral lower extremity: Plantar flexion, dorsiflexion, knee flexion, knee extension.  Plan of Care  Orders:  No orders of the defined types were placed in this encounter.   Medications ordered for procedure: Meds ordered this encounter  Medications  . iohexol (OMNIPAQUE) 180 MG/ML injection 10 mL    Must be Myelogram-compatible. If not available, you may substitute with a water-soluble, non-ionic, hypoallergenic, myelogram-compatible radiological contrast medium.  Marland Kitchen. lidocaine (XYLOCAINE) 2 % (with pres) injection 400 mg  . ropivacaine (PF) 2 mg/mL (0.2%) (NAROPIN) injection 1 mL  . dexamethasone (DECADRON) injection 10 mg  . sodium chloride flush (NS) 0.9 % injection 1 mL   Medications administered: We administered lidocaine, dexamethasone, and sodium chloride flush.  See the medical record for exact dosing, route, and time of administration.  Follow-up plan:   Return in about 3 weeks (around 11/04/2018) for Post Procedure Evaluation, virtual.      s/p L4/5 ESI #1 8/12   Recent Visits Date Type Provider Dept  10/05/18 Office Visit Edward JollyLateef, Alcee Sipos, MD [facility]  Showing recent visits within past 90 days and meeting all other requirements   Today's Visits Date Type Provider Dept  10/14/18 Procedure visit Edward JollyLateef, Melecio Cueto, MD [facility]  Showing today's visits and meeting all other  requirements   Future Appointments Date Type Provider Dept  10/28/18 Appointment Edward JollyLateef, Jarrah Seher, MD [facility]  Showing future appointments within next 90 days and meeting  all other requirements   Disposition: Discharge home  Discharge Date & Time: 10/14/2018; 1120 hrs.   Primary Care Physician: Galen ManilaKennedy, Lauren Renee, NP Location[facility] Note by: Edward JollyBilal Hephzibah Strehle, MD Date: 10/14/2018; Time: 11:44 AM  Disclaimer:  Medicine is not an exact science. The only guarantee in medicine is that nothing is guaranteed. It is important to note that the decision to proceed with this intervention was based on the information collected from the patient. The Data and conclusions were drawn from the patient's questionnaire, the interview, and the physical examination. Because the information was provided in large part by the patient, it cannot be guaranteed that it has not been purposely or unconsciously manipulated. Every effort has been made to obtain as much relevant data as possible for this evaluation. It is important to note that the conclusions that lead to this procedure are derived in large part from the available data. Always take into account that the treatment will also be dependent on availability of resources and existing treatment guidelines, considered by other Pain Management Practitioners as being common knowledge and practice, at the time of the intervention. For Medico-Legal purposes, it is also important to point out that variation in procedural techniques and pharmacological choices are the acceptable norm. The indications, contraindications, technique, and results of the above procedure should only be interpreted and judged by a Board-Certified Interventional Pain Specialist with extensive familiarity and expertise in the same exact procedure and technique.

## 2018-10-15 ENCOUNTER — Ambulatory Visit: Payer: Self-pay | Admitting: Surgery

## 2018-10-15 ENCOUNTER — Telehealth: Payer: Self-pay | Admitting: *Deleted

## 2018-10-15 NOTE — Telephone Encounter (Signed)
Denies any problems post procedure

## 2018-10-15 NOTE — H&P (View-Only) (Signed)
Chief Complaint:  Morbid obesity contributing to back pain  History of Present Illness:  Carolyn Warner is an 43 y.o. 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  She attended our seminar at [facility] and was seen in March desiring a sleeve gastrectomy.  Her BMI is 43.  She denies GERD but on UGI some reflux was noted without hiatal hernia.  She was seen in the office on August 13 and questions were answered.    Past Medical History:  Diagnosis Date  . Anxiety   . 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 Common migraine with intractable migraine 05/31/2016  . Depression   . Headache   . Thyroid disease     Past Surgical History:  Procedure Laterality Date  . NO PAST SURGERIES      Current Outpatient Medications  Medication Sig Dispense Refill  . baclofen (LIORESAL) 10 MG tablet Take 10 mg by mouth 3 (three) times daily.    . DULoxetine 40 MG CPEP Take 40 mg by mouth daily. 30 capsule 5  . hydrOXYzine (ATARAX/VISTARIL) 10 MG tablet Take 1-2 tablets (10-20 mg total) by mouth 3 (three) times daily as needed for anxiety. 60 tablet 1  . ibuprofen (ADVIL,MOTRIN) 800 MG tablet Take 1 tablet (800 mg total) by mouth every 8 (eight) hours as needed for mild pain. 75 tablet 2  . levonorgestrel (MIRENA) 20 MCG/24HR IUD by Intrauterine route.    . rizatriptan (MAXALT) 10 MG tablet Take 1 tablet at the onset of migraine. May repeat in 2 hours if needed 10 tablet 11  . SYNTHROID 200 MCG tablet Take 1 tablet (200 mcg total) by mouth daily. 90 tablet 1  . tiZANidine (ZANAFLEX) 4 MG capsule TAKE 1 CAPSULE (4 MG TOTAL) BY MOUTH 3 (THREE) TIMES DAILY AS NEEDED FOR MUSCLE SPASMS. 60 capsule 1   No current facility-administered medications for this visit.    Patient has no known allergies. Family History  Problem Relation Age of Onset  . Thyroid disease Mother   . Healthy Father   . Alzheimer's disease Maternal Grandmother   . Thyroid cancer Maternal Grandmother   . Stroke Paternal  Grandmother   . Thyroid disease Son   . Healthy Maternal Grandfather    Social History:   reports that she has never smoked. She has never used smokeless tobacco. She reports that she does not drink alcohol or use drugs.   REVIEW OF SYSTEMS : Negative except for see problem list  Physical Exam:   There were no vitals taken for this visit. There is no height or weight on file to calculate BMI.  Gen:  WDWN WF NAD  Neurological: Alert and oriented to person, place, and time. Motor and sensory function is grossly intact  Head: Normocephalic and atraumatic.  Eyes: Conjunctivae are normal. Pupils are equal, round, and reactive to light. No scleral icterus.  Neck: Normal range of motion. Neck supple. No tracheal deviation or thyromegaly present.  Cardiovascular:  SR without murmurs or gallops.  No carotid bruits Breast:  Not examined Respiratory: Effort normal.  No respiratory distress. No chest wall tenderness. Breath sounds normal.  No wheezes, rales or rhonchi.  Abdomen:  obese GU:  Not examined Musculoskeletal: Normal range of motion. Extremities are nontender. No cyanosis, edema or clubbing noted Lymphadenopathy: No cervical, preauricular, postauricular or axillary adenopathy is present Skin: Skin is warm and dry. No rash noted. No diaphoresis. No erythema. No pallor. Pscyh: Normal mood and   affect. Behavior is normal. Judgment and thought content normal.   LABORATORY RESULTS: No results found for this or any previous visit (from the past 48 hour(s)).   RADIOLOGY RESULTS: [facility] C-arm 1-60 Min No Report  Result Date: 10/14/2018 Fluoro was used, but no Radiologist interpretation will be provided. Please refer to "NOTES" tab for provider progress note.   Problem List: Patient Active Problem List   Diagnosis Date Noted  . Chronic radicular lumbar pain 10/05/2018  . Class 3 severe obesity without serious comorbidity with body mass index (BMI) of 40.0 to 44.9 in adult (Carolyn Warner)  10/05/2018  . Osteoarthritis of spine with radiculopathy, lumbar region 10/05/2018  . Chronic pain syndrome 10/05/2018  . Chronic bilateral low back pain with bilateral sciatica 06/17/2018  . 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 Acquired hypothyroidism 03/16/2018  . Common migraine with intractable migraine 05/31/2016    Assessment & Plan: Morbid obesity for lap sleeve gastrectomy.      Matt B. Hassell Done, MD, [facility] [phone] beeper [phone]  10/15/2018 10:23 AM

## 2018-10-15 NOTE — H&P (Signed)
Chief Complaint:  Morbid obesity contributing to back pain  History of Present Illness:  Carolyn Warner is an 43 y.o. 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  She attended our seminar at [facility] and was seen in March desiring a sleeve gastrectomy.  Her BMI is 43.  She denies GERD but on UGI some reflux was noted without hiatal hernia.  She was seen in the office on August 13 and questions were answered.    Past Medical History:  Diagnosis Date  . Anxiety   . 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 Common migraine with intractable migraine 05/31/2016  . Depression   . Headache   . Thyroid disease     Past Surgical History:  Procedure Laterality Date  . NO PAST SURGERIES      Current Outpatient Medications  Medication Sig Dispense Refill  . baclofen (LIORESAL) 10 MG tablet Take 10 mg by mouth 3 (three) times daily.    . DULoxetine 40 MG CPEP Take 40 mg by mouth daily. 30 capsule 5  . hydrOXYzine (ATARAX/VISTARIL) 10 MG tablet Take 1-2 tablets (10-20 mg total) by mouth 3 (three) times daily as needed for anxiety. 60 tablet 1  . ibuprofen (ADVIL,MOTRIN) 800 MG tablet Take 1 tablet (800 mg total) by mouth every 8 (eight) hours as needed for mild pain. 75 tablet 2  . levonorgestrel (MIRENA) 20 MCG/24HR IUD by Intrauterine route.    . rizatriptan (MAXALT) 10 MG tablet Take 1 tablet at the onset of migraine. May repeat in 2 hours if needed 10 tablet 11  . SYNTHROID 200 MCG tablet Take 1 tablet (200 mcg total) by mouth daily. 90 tablet 1  . tiZANidine (ZANAFLEX) 4 MG capsule TAKE 1 CAPSULE (4 MG TOTAL) BY MOUTH 3 (THREE) TIMES DAILY AS NEEDED FOR MUSCLE SPASMS. 60 capsule 1   No current facility-administered medications for this visit.    Patient has no known allergies. Family History  Problem Relation Age of Onset  . Thyroid disease Mother   . Healthy Father   . Alzheimer's disease Maternal Grandmother   . Thyroid cancer Maternal Grandmother   . Stroke Paternal  Grandmother   . Thyroid disease Son   . Healthy Maternal Grandfather    Social History:   reports that she has never smoked. She has never used smokeless tobacco. She reports that she does not drink alcohol or use drugs.   REVIEW OF SYSTEMS : Negative except for see problem list  Physical Exam:   There were no vitals taken for this visit. There is no height or weight on file to calculate BMI.  Gen:  WDWN WF NAD  Neurological: Alert and oriented to person, place, and time. Motor and sensory function is grossly intact  Head: Normocephalic and atraumatic.  Eyes: Conjunctivae are normal. Pupils are equal, round, and reactive to light. No scleral icterus.  Neck: Normal range of motion. Neck supple. No tracheal deviation or thyromegaly present.  Cardiovascular:  SR without murmurs or gallops.  No carotid bruits Breast:  Not examined Respiratory: Effort normal.  No respiratory distress. No chest wall tenderness. Breath sounds normal.  No wheezes, rales or rhonchi.  Abdomen:  obese GU:  Not examined Musculoskeletal: Normal range of motion. Extremities are nontender. No cyanosis, edema or clubbing noted Lymphadenopathy: No cervical, preauricular, postauricular or axillary adenopathy is present Skin: Skin is warm and dry. No rash noted. No diaphoresis. No erythema. No pallor. Pscyh: Normal mood and  affect. Behavior is normal. Judgment and thought content normal.   LABORATORY RESULTS: No results found for this or any previous visit (from the past 48 hour(s)).   RADIOLOGY RESULTS: [facility] C-arm 1-60 Min No Report  Result Date: 10/14/2018 Fluoro was used, but no Radiologist interpretation will be provided. Please refer to "NOTES" tab for provider progress note.   Problem List: Patient Active Problem List   Diagnosis Date Noted  . Chronic radicular lumbar pain 10/05/2018  . Class 3 severe obesity without serious comorbidity with body mass index (BMI) of 40.0 to 44.9 in adult ([place])  10/05/2018  . Osteoarthritis of spine with radiculopathy, lumbar region 10/05/2018  . Chronic pain syndrome 10/05/2018  . Chronic bilateral low back pain with bilateral sciatica 06/17/2018  . 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 Acquired hypothyroidism 03/16/2018  . Common migraine with intractable migraine 05/31/2016    Assessment & Plan: Morbid obesity for lap sleeve gastrectomy.      Matt B. Hassell Done, MD, [facility] [phone] beeper [phone]  10/15/2018 10:23 AM

## 2018-10-27 ENCOUNTER — Encounter: Payer: Self-pay | Admitting: Student in an Organized Health Care Education/Training Program

## 2018-10-28 ENCOUNTER — Ambulatory Visit
Payer: BC Managed Care – PPO | Attending: Student in an Organized Health Care Education/Training Program | Admitting: Student in an Organized Health Care Education/Training Program

## 2018-10-28 ENCOUNTER — Other Ambulatory Visit: Payer: Self-pay

## 2018-10-28 ENCOUNTER — Encounter: Payer: Self-pay | Admitting: Student in an Organized Health Care Education/Training Program

## 2018-10-28 DIAGNOSIS — G894 Chronic pain syndrome: Secondary | ICD-10-CM

## 2018-10-28 DIAGNOSIS — G8929 Other chronic pain: Secondary | ICD-10-CM

## 2018-10-28 DIAGNOSIS — M5416 Radiculopathy, lumbar region: Secondary | ICD-10-CM

## 2018-10-28 DIAGNOSIS — M5442 Lumbago with sciatica, left side: Secondary | ICD-10-CM | POA: Diagnosis not present

## 2018-10-28 DIAGNOSIS — M4726 Other spondylosis with radiculopathy, lumbar region: Secondary | ICD-10-CM | POA: Diagnosis not present

## 2018-10-28 DIAGNOSIS — Z6841 Body Mass Index (BMI) 40.0 and over, adult: Secondary | ICD-10-CM

## 2018-10-28 DIAGNOSIS — M5441 Lumbago with sciatica, right side: Secondary | ICD-10-CM

## 2018-10-28 DIAGNOSIS — M7918 Myalgia, other site: Secondary | ICD-10-CM

## 2018-10-28 NOTE — Progress Notes (Signed)
Pain Management Virtual Encounter Note - Virtual Visit via Video Conference Telehealth (real-time audio visits between healthcare provider and patient).   Patient's Phone No. & Preferred Pharmacy:  [phone] (home); [phone] (mobile); (Preferred) [phone] [email or web address]  [id number] - [place], [place] - [address] [place] [id number] Phone: [phone] Fax: [phone]    Pre-screening note:  Our staff contacted Carolyn Warner and offered her an "in person", "face-to-face" appointment versus a telephone encounter. She indicated preferring the telephone encounter, at this time.   Reason for Virtual Visit: COVID-19*  Social distancing based on CDC and AMA recommendations.   I contacted Carolyn Warner Carolyn Warner on 10/28/2018 via video conference.      I clearly identified myself as Carolyn JollyBilal Fantasy Donald, MD. I verified that I was speaking with the correct person using two identifiers (Name: Carolyn Warner, and date of birth: 1975/04/16).  Advanced Informed Consent I sought verbal advanced consent from Carolyn Warner for virtual visit interactions. I informed Carolyn Warner of possible security and privacy concerns, risks, and limitations associated with providing "not-in-person" medical evaluation and management services. I also informed Carolyn Warner of the availability of "in-person" appointments. Finally, I informed her that there would be a charge for the virtual visit and that she could be  personally, fully or partially, financially responsible for it. Carolyn Warner expressed understanding and agreed to proceed.   Historic Elements   Carolyn Warner is a 43 y.o. year old, female patient evaluated today after her last encounter by our practice on 10/15/2018. 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 She  also  has a past surgical history that includes No past surgeries. Carolyn Warner has a current medication list which includes the following prescription(s): baclofen, duloxetine hcl, hydroxyzine, ibuprofen, levonorgestrel, rizatriptan, synthroid, and tizanidine. She  reports that she has never smoked. She has never used smokeless tobacco. She reports that she does not drink alcohol or use drugs. Carolyn Warner has No Known Allergies.   HPI  Today, she is being contacted for a post-procedure assessment.   Evaluation of last interventional procedure  10/14/2018 Procedure: L4-L5 ESI #1 Pre-procedure pain score:  7/10 Post-procedure pain score: 0/10         Influential Factors: Intra-procedural challenges: None observed.         Reported side-effects: None.        Post-procedural adverse reactions or complications: None reported         Sedation: Please see nurses note for DOS. When no sedatives are used, the analgesic levels obtained are directly associated to the effectiveness of the local anesthetics. However, when sedation is provided, the level of analgesia obtained during the initial 1 hour following the intervention, is believed to be the result of a combination of factors. These factors may include, but are not limited to: 1. The effectiveness of the local anesthetics used. 2. The effects of the analgesic(s) and/or anxiolytic(s) used. 3. The degree of discomfort experienced by the patient at the time of the procedure. 4. The patients ability and reliability in recalling and recording the events. 5. The presence and influence of possible secondary gains and/or psychosocial factors. Reported result: Relief experienced during the 1st hour after the procedure: 100%   (Ultra-Short Term Relief)            Interpretative annotation: Clinically appropriate result. Analgesia  during this period is likely to be Local Anesthetic and/or IV Sedative (Analgesic/Anxiolytic) related.          Effects of local  anesthetic: The analgesic effects attained during this period are directly associated to the localized infiltration of local anesthetics and therefore cary significant diagnostic value as to the etiological location, or anatomical origin, of the pain. Expected duration of relief is directly dependent on the pharmacodynamics of the local anesthetic used. Long-acting (4-6 hours) anesthetics used.  Reported result: Relief during the next 4 to 6 hour after the procedure:100%   (Short-Term Relief)            Interpretative annotation: Clinically appropriate result. Analgesia during this period is likely to be Local Anesthetic-related.          Long-term benefit: Defined as the period of time past the expected duration of local anesthetics (1 hour for short-acting and 4-6 hours for long-acting). With the possible exception of prolonged sympathetic blockade from the local anesthetics, benefits during this period are typically attributed to, or associated with, other factors such as analgesic sensory neuropraxia, antiinflammatory effects, or beneficial biochemical changes provided by agents other than the local anesthetics.  Reported result: Extended relief following procedure:80% for first 2 weeks, now gradually decreasing but still better than before   (Long-Term Relief)            Interpretative annotation: Clinically appropriate result. Good relief. No permanent benefit expected. Inflammation plays a part in the etiology to the pain.          Repeat PRN, patient having gastric sleeve surgery 11/03/2018   Laboratory Chemistry Profile (12 mo)  Renal: 04/29/2018: BUN 17; BUN/Creatinine Ratio NOT APPLICABLE; Creat 0.84  Lab Results  Component Value Date   GFRAA 100 04/29/2018   GFRNONAA 86 04/29/2018   Hepatic: No results found for requested labs within last 8760 hours. Lab Results  Component Value Date   AST 32 (H) 04/29/2018   ALT 57 (H) 04/29/2018   Other: 04/29/2018: Vit D, 25-Hydroxy 19 Note: Above  Lab results reviewed.  Imaging  Last 90 days:  Dg Chest 2 View  Result Date: 08/04/2018 CLINICAL DATA:  Gastric sleeve surgery.  Preoperative exam. EXAM: CHEST - 2 VIEW COMPARISON:  No recent prior. FINDINGS: Mediastinum and hilar structures normal. Heart size normal. No focal infiltrate. No pleural effusion or pneumothorax. IMPRESSION: No acute cardiopulmonary disease. Electronically Signed   By: Maisie Fus  Register   On: 08/04/2018 09:24   Dg Kayleen Memos W Double Cm (hd Ba)  Result Date: 08/04/2018 CLINICAL DATA:  Preop assessment for gastric sleeve procedure. EXAM: UPPER GI SERIES WITHOUT KUB TECHNIQUE: Routine upper GI series was performed with thin/high density/water soluble barium. FLUOROSCOPY TIME:  Fluoroscopy Time:  24 seconds Radiation Exposure Index (if provided by the fluoroscopic device): 9.6 mGy Number of Acquired Spot Images: 4 COMPARISON:  None. FINDINGS: Examination of the esophagus demonstrated normal esophageal motility. Normal esophageal morphology without evidence of esophagitis or ulceration. No esophageal stricture, diverticula, or mass lesion. No evidence of hiatal hernia. Mild gastroesophageal reflux. Examination of the stomach demonstrated normal rugal folds and areae gastricae. The gastric mucosa appeared unremarkable without evidence of ulceration, scarring, or mass lesion. Gastric motility and emptying was normal. Fluoroscopic examination of the duodenum demonstrates normal motility and morphology without evidence of ulceration or mass lesion. IMPRESSION: 1. Mild gastroesophageal reflux. 2. Otherwise normal upper GI. Electronically Signed   By: Elige Ko   On: 08/04/2018 09:28   [facility]  C-arm 1-60 Min No Report  Result Date: 10/14/2018 Fluoro was used, but no Radiologist interpretation will be provided. Please refer to "NOTES" tab for provider progress note.   Assessment  The primary encounter diagnosis was Chronic radicular lumbar pain. Diagnoses of Chronic bilateral low  back pain with bilateral sciatica, Class 3 severe obesity without serious comorbidity with body mass index (BMI) of 40.0 to 44.9 in adult, unspecified obesity type ([place]), Osteoarthritis of spine with radiculopathy, lumbar region, Chronic pain syndrome, and Musculoskeletal pain were also pertinent to this visit.  Plan of Care  I am having Claudine Mouton maintain her levonorgestrel, rizatriptan, Synthroid, ibuprofen, DULoxetine HCl, hydrOXYzine, baclofen, and tiZANidine.  Orders:  Orders Placed This Encounter  Procedures  . Lumbar Epidural Injection    Standing Status:   Standing    Number of Occurrences:   9    Standing Expiration Date:   04/29/2020    Scheduling Instructions:     Purpose: Palliative     Indication: Lower extremity pain/Sciatica unspecified side (M54.30).     Side: Midline L4/5     Level: TBD     Sedation: w/o     TIMEFRAME: PRN procedure. (Ms. Stirn will call when needed.)    Order Specific Question:   Where will this procedure be performed?    Answer:   [facility]   Follow-up plan:   Return if symptoms worsen or fail to improve.     s/p L4/5 ESI #1 8/12- helped, repeat PRN    Recent Visits Date Type Provider Dept  10/14/18 Procedure visit Gillis Santa, MD [facility]  10/05/18 Office Visit Gillis Santa, MD [facility]  Showing recent visits within past 90 days and meeting all other requirements   Today's Visits Date Type Provider Dept  10/28/18 Office Visit Gillis Santa, MD [facility]  Showing today's visits and meeting all other requirements   Future Appointments No visits were found meeting these conditions.  Showing future appointments within next 90 days and meeting all other requirements   I discussed the assessment and treatment plan with the patient. The patient was provided an opportunity to ask questions and all were answered. The patient agreed with the plan and demonstrated an understanding of the  instructions.  Patient advised to call back or seek an in-person evaluation if the symptoms or condition worsens.  Total duration of non-face-to-face encounter: 15 minutes.  Note by: Gillis Santa, MD Date: 10/28/2018; Time: 2:11 PM  Note: This dictation was prepared with Dragon dictation. Any transcriptional errors that may result from this process are unintentional.  Disclaimer:  * Given the special circumstances of the COVID-19 pandemic, the federal government has announced that the Office for Civil Rights (OCR) will exercise its enforcement discretion and will not impose penalties on physicians using telehealth in the event of noncompliance with regulatory requirements under the [place] (HIPAA) in connection with the good faith provision of telehealth during the RKYHC-62 national public health emergency. ([place])

## 2018-10-30 ENCOUNTER — Other Ambulatory Visit (HOSPITAL_COMMUNITY)
Admission: RE | Admit: 2018-10-30 | Discharge: 2018-10-30 | Disposition: A | Discharge status: Home / Self Care | Payer: BC Managed Care – PPO | Source: Ambulatory Visit | Attending: Surgery | Admitting: Surgery

## 2018-10-30 DIAGNOSIS — Z20828 Contact with and (suspected) exposure to other viral communicable diseases: Secondary | ICD-10-CM | POA: Insufficient documentation

## 2018-10-30 DIAGNOSIS — Z01812 Encounter for preprocedural laboratory examination: Secondary | ICD-10-CM | POA: Insufficient documentation

## 2018-10-30 LAB — SARS CORONAVIRUS 2 (TAT 6-24 HRS): SARS Coronavirus 2: NEGATIVE

## 2018-10-30 NOTE — Patient Instructions (Addendum)
DUE TO COVID-19 ONLY ONE VISITOR IS ALLOWED TO COME WITH YOU AND STAY IN THE WAITING ROOM ONLY DURING PRE OP AND PROCEDURE DAY OF SURGERY. THE 1 VISITOR MAY VISIT WITH YOU AFTER SURGERY IN YOUR PRIVATE ROOM DURING VISITING HOURS ONLY!  YOU HAD A COVID 19 TEST ON 10-30-18.  PLEASE CONTINUE THE QUARANTINE INSTRUCTIONS AS OUTLINED IN YOUR HANDOUT.                Carolyn Warner   Your procedure is scheduled on: 11-03-18    Report to [facility] Main  Entrance    Report to Short Stay at 5:30 AM     Call this number if you have problems the morning of surgery [phone]    Remember: NO SOLID FOOD AFTER 6:00 PM THE NIGHT BEFORE YOUR SURGERY. YOU MAY DRINK CLEAR FLUIDS. MORNING OF SURGERY DRINK:   DRINK 1 G2 drink BEFORE YOU LEAVE HOME, DRINK ALL OF THE  G2 DRINK AT ONE TIME.   THE G2 DRINK YOU DRINK BEFORE YOU LEAVE HOME WILL BE THE LAST FLUIDS YOU DRINK BEFORE SURGERY.     CLEAR LIQUID DIET   Foods Allowed                                                                     Foods Excluded  Coffee and tea, regular and decaf                             liquids that you cannot  Plain Jell-O any favor except red or purple                                           see through such as: Fruit ices (not with fruit pulp)                                     milk, soups, orange juice  Iced Popsicles                                    All solid food Carbonated beverages, regular and diet                                    Cranberry, grape and apple juices Sports drinks like Gatorade Lightly seasoned clear broth or consume(fat free) Sugar, honey syrup  Sample Menu Breakfast                                Lunch                                     Supper Cranberry juice  Beef broth                            Chicken broth Jell-O                                     Grape juice                           Apple juice Coffee or tea                        Jell-O                                       Popsicle                                                Coffee or tea                        Coffee or tea  _____________________________________________________________________      Take these medicines the morning of surgery with A SIP OF WATER: Duloxetine, Synthroid, and Rizatriptan (Maxalt) if needed   BRUSH YOUR TEETH MORNING OF SURGERY AND RINSE YOUR MOUTH OUT, NO CHEWING GUM CANDY OR MINTS.                                 You may not have any metal on your body including hair pins and              piercings     Do not wear jewelry, make-up, lotions, powders or perfumes, deodorant              Do not wear nail polish.  Do not shave  48 hours prior to surgery.                 Do not bring valuables to the hospital. [place].  Contacts, dentures or bridgework may not be worn into surgery.  Leave suitcase in the car. After surgery it may be brought to your room.     Special Instructions: N/A              Please read over the following fact sheets you were given: _____________________________________________________________________             [facility] - Preparing for Surgery Before surgery, you can play an important role.  Because skin is not sterile, your skin needs to be as free of germs as possible.  You can reduce the number of germs on your skin by washing with CHG (chlorahexidine gluconate) soap before surgery.  CHG is an antiseptic cleaner which kills germs and bonds with the skin to continue killing germs even after washing. Please DO NOT use if you have an allergy to CHG or antibacterial soaps.  If your skin becomes reddened/irritated stop using the CHG and inform your nurse when  you arrive at Short Stay. Do not shave (including legs and underarms) for at least 48 hours prior to the first CHG shower.  You may shave your face/neck. Please follow these instructions carefully:  1.  Shower  with CHG Soap the night before surgery and the  morning of Surgery.  2.  If you choose to wash your hair, wash your hair first as usual with your  normal  shampoo.  3.  After you shampoo, rinse your hair and body thoroughly to remove the  shampoo.                           4.  Use CHG as you would any other liquid soap.  You can apply chg directly  to the skin and wash                       Gently with a scrungie or clean washcloth.  5.  Apply the CHG Soap to your body ONLY FROM THE NECK DOWN.   Do not use on face/ open                           Wound or open sores. Avoid contact with eyes, ears mouth and genitals (private parts).                       Wash face,  Genitals (private parts) with your normal soap.             6.  Wash thoroughly, paying special attention to the area where your surgery  will be performed.  7.  Thoroughly rinse your body with warm water from the neck down.  8.  DO NOT shower/wash with your normal soap after using and rinsing off  the CHG Soap.                9.  Pat yourself dry with a clean towel.            10.  Wear clean pajamas.            11.  Place clean sheets on your bed the night of your first shower and do not  sleep with pets. Day of Surgery : Do not apply any lotions/deodorants the morning of surgery.  Please wear clean clothes to the hospital/surgery center.    FAILURE TO FOLLOW THESE INSTRUCTIONS MAY RESULT IN THE CANCELLATION OF YOUR SURGERY PATIENT SIGNATURE_________________________________  NURSE SIGNATURE__________________________________  ________________________________________________________________________    PAIN IS EXPECTED AFTER SURGERY AND WILL NOT BE COMPLETELY ELIMINATED. AMBULATION AND TYLENOL WILL HELP REDUCE INCISIONAL AND GAS PAIN. MOVEMENT IS KEY!  YOU ARE EXPECTED TO BE OUT OF BED WITHIN 4 HOURS OF ADMISSION TO YOUR PATIENT ROOM.  SITTING IN THE RECLINER THROUGHOUT THE DAY IS IMPORTANT FOR DRINKING FLUIDS AND MOVING GAS  THROUGHOUT THE GI TRACT.  COMPRESSION STOCKINGS SHOULD BE WORN [facility] YOUR [facility] STAY UNLESS YOU ARE WALKING.   INCENTIVE SPIROMETER SHOULD BE USED EVERY HOUR WHILE AWAKE TO DECREASE POST-OPERATIVE COMPLICATIONS SUCH AS PNEUMONIA.  WHEN DISCHARGED HOME, IT IS IMPORTANT TO CONTINUE TO WALK EVERY HOUR AND USE THE INCENTIVE SPIROMETER EVERY HOUR.        Incentive Spirometer  An incentive spirometer is a tool that can help keep your lungs clear and active. This tool measures how well you are filling your lungs with each breath. Taking  long deep breaths may help reverse or decrease the chance of developing breathing (pulmonary) problems (especially infection) following:  A long period of time when you are unable to move or be active. BEFORE THE PROCEDURE   If the spirometer includes an indicator to show your best effort, your nurse or respiratory therapist will set it to a desired goal.  If possible, sit up straight or lean slightly forward. Try not to slouch.  Hold the incentive spirometer in an upright position. INSTRUCTIONS FOR USE  1. Sit on the edge of your bed if possible, or sit up as far as you can in bed or on a chair. 2. Hold the incentive spirometer in an upright position. 3. Breathe out normally. 4. Place the mouthpiece in your mouth and seal your lips tightly around it. 5. Breathe in slowly and as deeply as possible, raising the piston or the ball toward the top of the column. 6. Hold your breath for 3-5 seconds or for as long as possible. Allow the piston or ball to fall to the bottom of the column. 7. Remove the mouthpiece from your mouth and breathe out normally. 8. Rest for a few seconds and repeat Steps 1 through 7 at least 10 times every 1-2 hours when you are awake. Take your time and take a few normal breaths between deep breaths. 9. The spirometer may include an indicator to show your best effort. Use the indicator as a goal to work toward during each  repetition. 10. After each set of 10 deep breaths, practice coughing to be sure your lungs are clear. If you have an incision (the cut made at the time of surgery), support your incision when coughing by placing a pillow or rolled up towels firmly against it. Once you are able to get out of bed, walk around indoors and cough well. You may stop using the incentive spirometer when instructed by your caregiver.  RISKS AND COMPLICATIONS  Take your time so you do not get dizzy or light-headed.  If you are in pain, you may need to take or ask for pain medication before doing incentive spirometry. It is harder to take a deep breath if you are having pain. AFTER USE  Rest and breathe slowly and easily.  It can be helpful to keep track of a log of your progress. Your caregiver can provide you with a simple table to help with this. If you are using the spirometer at home, follow these instructions: [place] IF:   You are having difficultly using the spirometer.  You have trouble using the spirometer as often as instructed.  Your pain medication is not giving enough relief while using the spirometer.  You develop fever of 100.5 F (38.1 C) or higher. SEEK IMMEDIATE MEDICAL CARE IF:   You cough up bloody sputum that had not been present before.  You develop fever of 102 F (38.9 C) or greater.  You develop worsening pain at or near the incision site. MAKE SURE YOU:   Understand these instructions.  Will watch your condition.  Will get help right away if you are not doing well or get worse. Document Released: 07/01/2006 Document Revised: 05/13/2011 Document Reviewed: 09/01/2006 ExitCare Patient Information 2014 ExitCare, [place].   ________________________________________________________________________  WHAT IS A BLOOD TRANSFUSION? Blood Transfusion Information  A transfusion is the replacement of blood or some of its parts. Blood is made up of multiple cells which provide  different functions.  Red blood cells carry oxygen  and are used for blood loss replacement.  White blood cells fight against infection.  Platelets control bleeding.  Plasma helps clot blood.  Other blood products are available for specialized needs, such as hemophilia or other clotting disorders. BEFORE THE TRANSFUSION  Who gives blood for transfusions?   Healthy volunteers who are fully evaluated to make sure their blood is safe. This is blood bank blood. Transfusion therapy is the safest it has ever been in the practice of medicine. Before blood is taken from a donor, a complete history is taken to make sure that person has no history of diseases nor engages in risky social behavior (examples are intravenous drug use or sexual activity with multiple partners). The donor's travel history is screened to minimize risk of transmitting infections, such as malaria. The donated blood is tested for signs of infectious diseases, such as HIV and hepatitis. The blood is then tested to be sure it is compatible with you in order to minimize the chance of a transfusion reaction. If you or a relative donates blood, this is often done in anticipation of surgery and is not appropriate for emergency situations. It takes many days to process the donated blood. RISKS AND COMPLICATIONS Although transfusion therapy is very safe and saves many lives, the main dangers of transfusion include:   Getting an infectious disease.  Developing a transfusion reaction. This is an allergic reaction to something in the blood you were given. Every precaution is taken to prevent this. The decision to have a blood transfusion has been considered carefully by your caregiver before blood is given. Blood is not given unless the benefits outweigh the risks. AFTER THE TRANSFUSION  Right after receiving a blood transfusion, you will usually feel much better and more energetic. This is especially true if your red blood cells have  gotten low (anemic). The transfusion raises the level of the red blood cells which carry oxygen, and this usually causes an energy increase.  The nurse administering the transfusion will monitor you carefully for complications. HOME CARE INSTRUCTIONS  No special instructions are needed after a transfusion. You may find your energy is better. Speak with your caregiver about any limitations on activity for underlying diseases you may have. SEEK MEDICAL CARE IF:   Your condition is not improving after your transfusion.  You develop redness or irritation at the intravenous (IV) site. SEEK IMMEDIATE MEDICAL CARE IF:  Any of the following symptoms occur over the next 12 hours:  Shaking chills.  You have a temperature by mouth above 102 F (38.9 C), not controlled by medicine.  Chest, back, or muscle pain.  People around you feel you are not acting correctly or are confused.  Shortness of breath or difficulty breathing.  Dizziness and fainting.  You get a rash or develop hives.  You have a decrease in urine output.  Your urine turns a dark color or changes to pink, red, or brown. Any of the following symptoms occur over the next 10 days:  You have a temperature by mouth above 102 F (38.9 C), not controlled by medicine.  Shortness of breath.  Weakness after normal activity.  The white part of the eye turns yellow (jaundice).  You have a decrease in the amount of urine or are urinating less often.  Your urine turns a dark color or changes to pink, red, or brown. Document Released: 02/16/2000 Document Revised: 05/13/2011 Document Reviewed: 10/05/2007 [facility] Patient Information 2014 [place], [place].  _______________________________________________________________________

## 2018-10-30 NOTE — Progress Notes (Signed)
PCP - Dr. Cassell Smiles Cardiologist -   Chest x-ray - 08-04-18 EKG - 08-04-18 Stress Test -  ECHO -  Cardiac Cath -   Sleep Study -  CPAP -   Fasting Blood Sugar -  Checks Blood Sugar _____ times a day  Blood Thinner Instructions: Aspirin Instructions: Last Dose:  Anesthesia review:   Patient denies shortness of breath, fever, cough and chest pain at PAT appointment   Patient verbalized understanding of instructions that were given to them at the PAT appointment. Patient was also instructed that they will need to review over the PAT instructions again at home before surgery.

## 2018-11-02 ENCOUNTER — Encounter (HOSPITAL_COMMUNITY)
Admission: RE | Admit: 2018-11-02 | Discharge: 2018-11-02 | Disposition: A | Discharge status: Home / Self Care | Payer: BC Managed Care – PPO | Source: Ambulatory Visit | Attending: Surgery | Admitting: Surgery

## 2018-11-02 ENCOUNTER — Encounter (HOSPITAL_COMMUNITY): Payer: Self-pay

## 2018-11-02 ENCOUNTER — Other Ambulatory Visit: Payer: Self-pay

## 2018-11-02 DIAGNOSIS — Z01812 Encounter for preprocedural laboratory examination: Secondary | ICD-10-CM | POA: Insufficient documentation

## 2018-11-02 DIAGNOSIS — E669 Obesity, unspecified: Secondary | ICD-10-CM | POA: Insufficient documentation

## 2018-11-02 LAB — COMPREHENSIVE METABOLIC PANEL
ALT: 87 U/L — ABNORMAL HIGH (ref 0–44)
AST: 61 U/L — ABNORMAL HIGH (ref 15–41)
Albumin: 4.2 g/dL (ref 3.5–5.0)
Alkaline Phosphatase: 60 U/L (ref 38–126)
Anion gap: 8 (ref 5–15)
BUN: 17 mg/dL (ref 6–20)
CO2: 23 mmol/L (ref 22–32)
Calcium: 9.3 mg/dL (ref 8.9–10.3)
Chloride: 107 mmol/L (ref 98–111)
Creatinine, Ser: 0.73 mg/dL (ref 0.44–1.00)
GFR calc Af Amer: >60 mL/min (ref >60)
GFR calc non Af Amer: >60 mL/min (ref >60)
Glucose, Bld: 99 mg/dL (ref 70–99)
Potassium: 3.9 mmol/L (ref 3.5–5.1)
Sodium: 138 mmol/L (ref 135–145)
Total Bilirubin: 0.7 mg/dL (ref 0.3–1.2)
Total Protein: 7.5 g/dL (ref 6.5–8.1)

## 2018-11-02 LAB — CBC WITH DIFFERENTIAL/PLATELET
Abs Immature Granulocytes: 0.03 10*3/uL (ref 0.00–0.07)
Basophils Absolute: 0.1 10*3/uL (ref 0.0–0.1)
Basophils Relative: 1 %
Eosinophils Absolute: 0.2 10*3/uL (ref 0.0–0.5)
Eosinophils Relative: 4 %
HCT: 42.7 % (ref 36.0–46.0)
Hemoglobin: 14 g/dL (ref 12.0–15.0)
Immature Granulocytes: 0 %
Lymphocytes Relative: 30 %
Lymphs Abs: 2 10*3/uL (ref 0.7–4.0)
MCH: 30.3 pg (ref 26.0–34.0)
MCHC: 32.8 g/dL (ref 30.0–36.0)
MCV: 92.4 fL (ref 80.0–100.0)
Monocytes Absolute: 0.5 10*3/uL (ref 0.1–1.0)
Monocytes Relative: 7 %
Neutro Abs: 4 10*3/uL (ref 1.7–7.7)
Neutrophils Relative %: 58 %
Platelets: 270 10*3/uL (ref 150–400)
RBC: 4.62 MIL/uL (ref 3.87–5.11)
RDW: 13.1 % (ref 11.5–15.5)
WBC: 6.8 10*3/uL (ref 4.0–10.5)
nRBC: 0 % (ref 0.0–0.2)

## 2018-11-02 LAB — ABO/RH: ABO/RH(D): O POS

## 2018-11-02 MED ORDER — BUPIVACAINE LIPOSOME 1.3 % IJ SUSP: 20.0000 mL | Freq: Once | INTRAMUSCULAR | Status: DC

## 2018-11-02 MED ORDER — BUPIVACAINE LIPOSOME 1.3 % IJ SUSP
20.0000 mL | Freq: Once | INTRAMUSCULAR | Status: DC
  Filled 2018-11-02: qty 20

## 2018-11-02 NOTE — Anesthesia Preprocedure Evaluation (Addendum)
Anesthesia Evaluation  Patient identified by MRN, date of birth, ID band Patient awake    Reviewed: Allergy & Precautions, NPO status , Patient's Chart, lab work & pertinent test results  History of Anesthesia Complications Negative for: history of anesthetic complications  Airway Mallampati: II  TM Distance: >3 FB Neck ROM: Full    Dental  (+) Dental Advisory Given   Pulmonary  10/30/2018 SARS Coronavirus neg   breath sounds clear to auscultation       Cardiovascular negative cardio ROS   Rhythm:Regular Rate:Normal     Neuro/Psych  Headaches, Anxiety Depression chronic back pain    GI/Hepatic negative GI ROS, Elevated LFTS: probable fatty liver as per Dr. Hassell Done, he will examine and biopsy   Endo/Other  Hypothyroidism Morbid obesity  Renal/GU negative Renal ROS     Musculoskeletal   Abdominal (+) + obese,   Peds  Hematology negative hematology ROS (+)   Anesthesia Other Findings   Reproductive/Obstetrics mirena                            Anesthesia Physical Anesthesia Plan  ASA: III  Anesthesia Plan: General   Post-op Pain Management:    Induction: Intravenous  PONV Risk Score and Plan: 4 or greater and Scopolamine patch - Pre-op, Dexamethasone and Ondansetron  Airway Management Planned: Oral ETT  Additional Equipment:   Intra-op Plan:   Post-operative Plan: Extubation in OR  Informed Consent: I have reviewed the patients History and Physical, chart, labs and discussed the procedure including the risks, benefits and alternatives for the proposed anesthesia with the patient or authorized representative who has indicated his/her understanding and acceptance.     Dental advisory given  Plan Discussed with: CRNA and Surgeon  Anesthesia Plan Comments:        Anesthesia Quick Evaluation

## 2018-11-02 NOTE — Progress Notes (Signed)
PCP - Cassell Smiles, NP Cardiologist -   Chest x-ray - 08-04-18 EKG - 08-04-18 Stress Test -  ECHO -  Cardiac Cath -   Sleep Study -  CPAP -   Fasting Blood Sugar -  Checks Blood Sugar _____ times a day  Blood Thinner Instructions: Aspirin Instructions: Last Dose:  Anesthesia review:   Patient denies shortness of breath, fever, cough and chest pain at PAT appointment   Patient verbalized understanding of instructions that were given to them at the PAT appointment. Patient was also instructed that they will need to review over the PAT instructions again at home before surgery.

## 2018-11-03 ENCOUNTER — Inpatient Hospital Stay (HOSPITAL_COMMUNITY)
Admission: RE | Admit: 2018-11-03 | Discharge: 2018-11-05 | DRG: 621 | Disposition: A | Discharge status: Home / Self Care | Payer: BC Managed Care – PPO | Attending: Surgery | Admitting: Surgery

## 2018-11-03 ENCOUNTER — Encounter (HOSPITAL_COMMUNITY)
Admission: RE | Disposition: A | Discharge status: Home / Self Care | Payer: Self-pay | Source: Home / Self Care | Attending: Surgery

## 2018-11-03 ENCOUNTER — Inpatient Hospital Stay (HOSPITAL_COMMUNITY): Payer: BC Managed Care – PPO | Admitting: Physician Assistant

## 2018-11-03 ENCOUNTER — Encounter (HOSPITAL_COMMUNITY): Payer: Self-pay | Admitting: *Deleted

## 2018-11-03 ENCOUNTER — Inpatient Hospital Stay (HOSPITAL_COMMUNITY): Payer: BC Managed Care – PPO | Admitting: Anesthesiology

## 2018-11-03 DIAGNOSIS — K219 Gastro-esophageal reflux disease without esophagitis: Secondary | ICD-10-CM | POA: Diagnosis present

## 2018-11-03 DIAGNOSIS — Z79890 Hormone replacement therapy (postmenopausal): Secondary | ICD-10-CM | POA: Diagnosis not present

## 2018-11-03 DIAGNOSIS — Z6841 Body Mass Index (BMI) 40.0 and over, adult: Secondary | ICD-10-CM

## 2018-11-03 DIAGNOSIS — Z79899 Other long term (current) drug therapy: Secondary | ICD-10-CM | POA: Diagnosis not present

## 2018-11-03 DIAGNOSIS — Z9884 Bariatric surgery status: Secondary | ICD-10-CM

## 2018-11-03 DIAGNOSIS — Z808 Family history of malignant neoplasm of other organs or systems: Secondary | ICD-10-CM

## 2018-11-03 HISTORY — PX: LAPAROSCOPIC GASTRIC SLEEVE RESECTION: SHX5895

## 2018-11-03 LAB — TYPE AND SCREEN
ABO/RH(D): O POS
Antibody Screen: NEGATIVE

## 2018-11-03 LAB — HEMOGLOBIN AND HEMATOCRIT, BLOOD
HCT: 44.4 % (ref 36.0–46.0)
Hemoglobin: 14.7 g/dL (ref 12.0–15.0)

## 2018-11-03 LAB — PREGNANCY, URINE: Preg Test, Ur: NEGATIVE

## 2018-11-03 SURGERY — GASTRECTOMY, SLEEVE, LAPAROSCOPIC
Anesthesia: General | Site: Abdomen

## 2018-11-03 MED ORDER — SUGAMMADEX SODIUM 500 MG/5ML IV SOLN
INTRAVENOUS | Status: AC
  Filled 2018-11-03: qty 5

## 2018-11-03 MED ORDER — LIDOCAINE HCL 2 % IJ SOLN
INTRAMUSCULAR | Status: AC
  Filled 2018-11-03: qty 20

## 2018-11-03 MED ORDER — DEXAMETHASONE SODIUM PHOSPHATE 10 MG/ML IJ SOLN
INTRAMUSCULAR | Status: DC | PRN
  Administered 2018-11-03: 5 mg via INTRAVENOUS

## 2018-11-03 MED ORDER — 0.9 % SODIUM CHLORIDE (POUR BTL) OPTIME
TOPICAL | Status: DC | PRN
  Administered 2018-11-03: 08:00:00 1000 mL

## 2018-11-03 MED ORDER — LIDOCAINE HCL (PF) 1 % IJ SOLN
INTRAMUSCULAR | Status: AC
  Filled 2018-11-03: qty 30

## 2018-11-03 MED ORDER — GABAPENTIN 300 MG PO CAPS
300.0000 mg | ORAL_CAPSULE | ORAL | Status: AC
  Administered 2018-11-03: 300 mg via ORAL
  Filled 2018-11-03: qty 1

## 2018-11-03 MED ORDER — SODIUM CHLORIDE 0.9 % IV SOLN
2.0000 g | INTRAVENOUS | Status: AC
  Administered 2018-11-03: 2 g via INTRAVENOUS
  Filled 2018-11-03: qty 2

## 2018-11-03 MED ORDER — APREPITANT 40 MG PO CAPS
40.0000 mg | ORAL_CAPSULE | ORAL | Status: AC
  Administered 2018-11-03: 06:00:00 40 mg via ORAL
  Filled 2018-11-03: qty 1

## 2018-11-03 MED ORDER — MIDAZOLAM HCL 2 MG/2ML IJ SOLN
INTRAMUSCULAR | Status: AC
  Filled 2018-11-03: qty 2

## 2018-11-03 MED ORDER — MIDAZOLAM HCL 2 MG/2ML IJ SOLN: 0.5000 mg | Freq: Once | INTRAMUSCULAR | Status: DC | PRN

## 2018-11-03 MED ORDER — SODIUM CHLORIDE (PF) 0.9 % IJ SOLN
INTRAMUSCULAR | Status: AC
  Filled 2018-11-03: qty 10

## 2018-11-03 MED ORDER — ROCURONIUM BROMIDE 10 MG/ML (PF) SYRINGE
PREFILLED_SYRINGE | INTRAVENOUS | Status: DC | PRN
  Administered 2018-11-03: 20 mg via INTRAVENOUS
  Administered 2018-11-03 (×2): 10 mg via INTRAVENOUS
  Administered 2018-11-03: 60 mg via INTRAVENOUS
  Administered 2018-11-03: 20 mg via INTRAVENOUS

## 2018-11-03 MED ORDER — BUPIVACAINE LIPOSOME 1.3 % IJ SUSP
INTRAMUSCULAR | Status: DC | PRN
  Administered 2018-11-03: 20 mL

## 2018-11-03 MED ORDER — PANTOPRAZOLE SODIUM 40 MG IV SOLR
40.0000 mg | Freq: Every day | INTRAVENOUS | Status: DC
  Administered 2018-11-03 – 2018-11-04 (×2): 40 mg via INTRAVENOUS
  Filled 2018-11-03 (×2): qty 40

## 2018-11-03 MED ORDER — ACETAMINOPHEN 500 MG PO TABS: 1000.0000 mg | ORAL_TABLET | Freq: Three times a day (TID) | ORAL | Status: DC

## 2018-11-03 MED ORDER — KETAMINE HCL 10 MG/ML IJ SOLN
INTRAMUSCULAR | Status: DC | PRN
  Administered 2018-11-03 (×2): 30 mg via INTRAVENOUS

## 2018-11-03 MED ORDER — LACTATED RINGERS IV SOLN
INTRAVENOUS | Status: DC
  Administered 2018-11-03 (×2): via INTRAVENOUS

## 2018-11-03 MED ORDER — DEXAMETHASONE SODIUM PHOSPHATE 10 MG/ML IJ SOLN
INTRAMUSCULAR | Status: AC
  Filled 2018-11-03: qty 1

## 2018-11-03 MED ORDER — HYDROMORPHONE HCL 1 MG/ML IJ SOLN
INTRAMUSCULAR | Status: AC
  Filled 2018-11-03: qty 2

## 2018-11-03 MED ORDER — ONDANSETRON HCL 4 MG/2ML IJ SOLN
4.0000 mg | INTRAMUSCULAR | Status: DC | PRN
  Administered 2018-11-04 – 2018-11-05 (×4): 4 mg via INTRAVENOUS
  Filled 2018-11-03 (×4): qty 2

## 2018-11-03 MED ORDER — LIDOCAINE 2% (20 MG/ML) 5 ML SYRINGE
INTRAMUSCULAR | Status: AC
  Filled 2018-11-03: qty 5

## 2018-11-03 MED ORDER — HEPARIN SODIUM (PORCINE) 5000 UNIT/ML IJ SOLN
5000.0000 [IU] | Freq: Three times a day (TID) | INTRAMUSCULAR | Status: DC
  Administered 2018-11-03 – 2018-11-05 (×6): 5000 [IU] via SUBCUTANEOUS
  Filled 2018-11-03 (×6): qty 1

## 2018-11-03 MED ORDER — HEPARIN SODIUM (PORCINE) 5000 UNIT/ML IJ SOLN
5000.0000 [IU] | INTRAMUSCULAR | Status: AC
  Administered 2018-11-03: 06:00:00 5000 [IU] via SUBCUTANEOUS
  Filled 2018-11-03: qty 1

## 2018-11-03 MED ORDER — MEPERIDINE HCL 50 MG/ML IJ SOLN: 6.2500 mg | INTRAMUSCULAR | Status: DC | PRN

## 2018-11-03 MED ORDER — SUMATRIPTAN SUCCINATE 50 MG PO TABS
100.0000 mg | ORAL_TABLET | Freq: Once | ORAL | Status: AC
  Administered 2018-11-03: 100 mg via ORAL
  Filled 2018-11-03: qty 2

## 2018-11-03 MED ORDER — LACTATED RINGERS IR SOLN
Status: DC | PRN
  Administered 2018-11-03: 1000 mL

## 2018-11-03 MED ORDER — PHENYLEPHRINE HCL (PRESSORS) 10 MG/ML IV SOLN
INTRAVENOUS | Status: AC
  Filled 2018-11-03: qty 1

## 2018-11-03 MED ORDER — ACETAMINOPHEN 160 MG/5ML PO SOLN
1000.0000 mg | Freq: Three times a day (TID) | ORAL | Status: DC
  Administered 2018-11-03: 1000 mg via ORAL
  Filled 2018-11-03 (×3): qty 40.6

## 2018-11-03 MED ORDER — FENTANYL CITRATE (PF) 250 MCG/5ML IJ SOLN
INTRAMUSCULAR | Status: AC
  Filled 2018-11-03: qty 5

## 2018-11-03 MED ORDER — ENSURE MAX PROTEIN PO LIQD
2.0000 [oz_av] | ORAL | Status: DC
  Administered 2018-11-05 (×4): 2 [oz_av] via ORAL

## 2018-11-03 MED ORDER — ONDANSETRON HCL 4 MG/2ML IJ SOLN
INTRAMUSCULAR | Status: AC
  Filled 2018-11-03: qty 2

## 2018-11-03 MED ORDER — BUPIVACAINE HCL (PF) 0.25 % IJ SOLN
INTRAMUSCULAR | Status: AC
  Filled 2018-11-03: qty 30

## 2018-11-03 MED ORDER — PROPOFOL 10 MG/ML IV BOLUS
INTRAVENOUS | Status: DC | PRN
  Administered 2018-11-03: 200 mg via INTRAVENOUS

## 2018-11-03 MED ORDER — SCOPOLAMINE 1 MG/3DAYS TD PT72
1.0000 | MEDICATED_PATCH | Freq: Once | TRANSDERMAL | Status: DC
  Administered 2018-11-03: 1.5 mg via TRANSDERMAL
  Filled 2018-11-03: qty 1

## 2018-11-03 MED ORDER — FENTANYL CITRATE (PF) 100 MCG/2ML IJ SOLN
INTRAMUSCULAR | Status: DC | PRN
  Administered 2018-11-03: 100 ug via INTRAVENOUS
  Administered 2018-11-03 (×3): 50 ug via INTRAVENOUS

## 2018-11-03 MED ORDER — MIDAZOLAM HCL 5 MG/5ML IJ SOLN
INTRAMUSCULAR | Status: DC | PRN
  Administered 2018-11-03: 2 mg via INTRAVENOUS

## 2018-11-03 MED ORDER — LIDOCAINE 20MG/ML (2%) 15 ML SYRINGE OPTIME
INTRAMUSCULAR | Status: DC | PRN
  Administered 2018-11-03: 1.5 mg/kg/h via INTRAVENOUS

## 2018-11-03 MED ORDER — PHENYLEPHRINE 40 MCG/ML (10ML) SYRINGE FOR IV PUSH (FOR BLOOD PRESSURE SUPPORT)
PREFILLED_SYRINGE | INTRAVENOUS | Status: DC | PRN
  Administered 2018-11-03: 80 ug via INTRAVENOUS
  Administered 2018-11-03 (×2): 120 ug via INTRAVENOUS

## 2018-11-03 MED ORDER — HYDRALAZINE HCL 20 MG/ML IJ SOLN
10.0000 mg | INTRAMUSCULAR | Status: DC | PRN
  Filled 2018-11-03: qty 1

## 2018-11-03 MED ORDER — CHLORHEXIDINE GLUCONATE CLOTH 2 % EX PADS: 6.0000 | MEDICATED_PAD | Freq: Once | CUTANEOUS | Status: DC

## 2018-11-03 MED ORDER — KCL IN DEXTROSE-NACL 20-5-0.45 MEQ/L-%-% IV SOLN
INTRAVENOUS | Status: DC
  Administered 2018-11-03 – 2018-11-04 (×4): via INTRAVENOUS
  Filled 2018-11-03 (×3): qty 1000

## 2018-11-03 MED ORDER — OXYCODONE HCL 5 MG/5ML PO SOLN: 5.0000 mg | Freq: Four times a day (QID) | ORAL | Status: DC | PRN

## 2018-11-03 MED ORDER — ACETAMINOPHEN 500 MG PO TABS
1000.0000 mg | ORAL_TABLET | ORAL | Status: AC
  Administered 2018-11-03: 1000 mg via ORAL
  Filled 2018-11-03: qty 2

## 2018-11-03 MED ORDER — HYDROMORPHONE HCL 1 MG/ML IJ SOLN
0.2500 mg | INTRAMUSCULAR | Status: DC | PRN
  Administered 2018-11-03 (×4): 0.5 mg via INTRAVENOUS

## 2018-11-03 MED ORDER — LIDOCAINE 2% (20 MG/ML) 5 ML SYRINGE
INTRAMUSCULAR | Status: DC | PRN
  Administered 2018-11-03: 80 mg via INTRAVENOUS

## 2018-11-03 MED ORDER — MORPHINE SULFATE (PF) 4 MG/ML IV SOLN
1.0000 mg | INTRAVENOUS | Status: DC | PRN
  Administered 2018-11-03 (×2): 2 mg via INTRAVENOUS
  Filled 2018-11-03 (×2): qty 1

## 2018-11-03 MED ORDER — ROCURONIUM BROMIDE 10 MG/ML (PF) SYRINGE
PREFILLED_SYRINGE | INTRAVENOUS | Status: AC
  Filled 2018-11-03: qty 10

## 2018-11-03 MED ORDER — PROMETHAZINE HCL 25 MG/ML IJ SOLN: 6.2500 mg | INTRAMUSCULAR | Status: DC | PRN

## 2018-11-03 MED ORDER — KETAMINE HCL 10 MG/ML IJ SOLN
INTRAMUSCULAR | Status: AC
  Filled 2018-11-03: qty 1

## 2018-11-03 MED ORDER — PHENYLEPHRINE 40 MCG/ML (10ML) SYRINGE FOR IV PUSH (FOR BLOOD PRESSURE SUPPORT)
PREFILLED_SYRINGE | INTRAVENOUS | Status: AC
  Filled 2018-11-03: qty 10

## 2018-11-03 MED ORDER — ONDANSETRON HCL 4 MG/2ML IJ SOLN
INTRAMUSCULAR | Status: DC | PRN
  Administered 2018-11-03: 4 mg via INTRAVENOUS

## 2018-11-03 MED ORDER — LEVOTHYROXINE SODIUM 200 MCG PO TABS
200.0000 ug | ORAL_TABLET | Freq: Every day | ORAL | Status: DC
  Administered 2018-11-04 – 2018-11-05 (×2): 200 ug via ORAL
  Filled 2018-11-03 (×2): qty 1
  Filled 2018-11-03 (×2): qty 2

## 2018-11-03 MED ORDER — PROPOFOL 10 MG/ML IV BOLUS
INTRAVENOUS | Status: AC
  Filled 2018-11-03: qty 20

## 2018-11-03 MED ORDER — SODIUM CHLORIDE (PF) 0.9 % IJ SOLN
INTRAMUSCULAR | Status: DC | PRN
  Administered 2018-11-03: 10 mL

## 2018-11-03 SURGICAL SUPPLY — 75 items
APPLICATOR COTTON TIP 6 STRL (MISCELLANEOUS) IMPLANT
APPLICATOR COTTON TIP 6IN STRL (MISCELLANEOUS)
APPLIER CLIP 5 13 M/L LIGAMAX5 (MISCELLANEOUS)
APPLIER CLIP ROT 10 11.4 M/L (STAPLE)
APPLIER CLIP ROT 13.4 12 LRG (CLIP)
BAG LAPAROSCOPIC 12 15 PORT 16 (BASKET) ×1 IMPLANT
BAG RETRIEVAL 12/15 (BASKET) ×2
BAG RETRIEVAL 12/15MM (BASKET) ×1
BLADE SURG 15 STRL LF DISP TIS (BLADE) ×1 IMPLANT
BLADE SURG 15 STRL SS (BLADE) ×2
CABLE HIGH FREQUENCY MONO STRZ (ELECTRODE) IMPLANT
CLIP APPLIE 5 13 M/L LIGAMAX5 (MISCELLANEOUS) IMPLANT
CLIP APPLIE ROT 10 11.4 M/L (STAPLE) IMPLANT
CLIP APPLIE ROT 13.4 12 LRG (CLIP) IMPLANT
COVER WAND RF STERILE (DRAPES) IMPLANT
DERMABOND ADVANCED (GAUZE/BANDAGES/DRESSINGS) ×4
DERMABOND ADVANCED .7 DNX12 (GAUZE/BANDAGES/DRESSINGS) ×2 IMPLANT
DEVICE SUT QUICK LOAD TK 5 (STAPLE) IMPLANT
DEVICE SUT TI-KNOT TK 5X26 (MISCELLANEOUS) IMPLANT
DEVICE SUTURE ENDOST 10MM (ENDOMECHANICALS) IMPLANT
DEVICE TI KNOT TK5 (MISCELLANEOUS)
DISSECTOR BLUNT TIP ENDO 5MM (MISCELLANEOUS) IMPLANT
DRAPE UTILITY XL STRL (DRAPES) ×6 IMPLANT
ELECT REM PT RETURN 15FT ADLT (MISCELLANEOUS) ×3 IMPLANT
GAUZE SPONGE 4X4 12PLY STRL (GAUZE/BANDAGES/DRESSINGS) IMPLANT
GLOVE BIOGEL M 8.0 STRL (GLOVE) ×3 IMPLANT
GLOVE BIOGEL PI IND STRL 7.0 (GLOVE) ×2 IMPLANT
GLOVE BIOGEL PI IND STRL 7.5 (GLOVE) ×2 IMPLANT
GLOVE BIOGEL PI INDICATOR 7.0 (GLOVE) ×4
GLOVE BIOGEL PI INDICATOR 7.5 (GLOVE) ×4
GLOVE SURG SS PI 6.5 STRL IVOR (GLOVE) ×3 IMPLANT
GLOVE SURG SS PI 7.0 STRL IVOR (GLOVE) ×3 IMPLANT
GLOVE SURG SYN 7.5  E (GLOVE) ×2
GLOVE SURG SYN 7.5 E (GLOVE) ×1 IMPLANT
GOWN STRL REUS W/ TWL LRG LVL3 (GOWN DISPOSABLE) ×1 IMPLANT
GOWN STRL REUS W/TWL LRG LVL3 (GOWN DISPOSABLE) ×2
GOWN STRL REUS W/TWL XL LVL3 (GOWN DISPOSABLE) ×12 IMPLANT
GRASPER SUT TROCAR 14GX15 (MISCELLANEOUS) ×3 IMPLANT
HANDLE STAPLE EGIA 4 XL (STAPLE) ×3 IMPLANT
HOVERMATT SINGLE USE (MISCELLANEOUS) ×3 IMPLANT
KIT BASIN OR (CUSTOM PROCEDURE TRAY) ×3 IMPLANT
KIT TURNOVER KIT A (KITS) IMPLANT
MARKER SKIN DUAL TIP RULER LAB (MISCELLANEOUS) ×3 IMPLANT
NEEDLE SPNL 22GX3.5 QUINCKE BK (NEEDLE) ×3 IMPLANT
PACK UNIVERSAL I (CUSTOM PROCEDURE TRAY) ×3 IMPLANT
QUICK LOAD TK 5 (STAPLE)
RELOAD TRI 45 ART MED THCK BLK (STAPLE) ×3 IMPLANT
RELOAD TRI 45 ART MED THCK PUR (STAPLE) ×3 IMPLANT
RELOAD TRI 60 ART MED THCK BLK (STAPLE) ×3 IMPLANT
RELOAD TRI 60 ART MED THCK PUR (STAPLE) ×6 IMPLANT
SCISSORS LAP 5X45 EPIX DISP (ENDOMECHANICALS) IMPLANT
SET IRRIG TUBING LAPAROSCOPIC (IRRIGATION / IRRIGATOR) ×3 IMPLANT
SET TUBE SMOKE EVAC HIGH FLOW (TUBING) ×3 IMPLANT
SHEARS HARMONIC ACE PLUS 45CM (MISCELLANEOUS) ×3 IMPLANT
SLEEVE ADV FIXATION 5X100MM (TROCAR) ×6 IMPLANT
SLEEVE GASTRECTOMY 36FR VISIGI (MISCELLANEOUS) ×3 IMPLANT
SOL ANTI FOG 6CC (MISCELLANEOUS) ×1 IMPLANT
SOLUTION ANTI FOG 6CC (MISCELLANEOUS) ×2
SPONGE LAP 18X18 RF (DISPOSABLE) ×3 IMPLANT
STAPLER VISISTAT 35W (STAPLE) IMPLANT
SUT MNCRL AB 4-0 PS2 18 (SUTURE) ×6 IMPLANT
SUT SURGIDAC NAB ES-9 0 48 120 (SUTURE) IMPLANT
SUT VICRYL 0 TIES 12 18 (SUTURE) ×3 IMPLANT
SYR 10ML ECCENTRIC (SYRINGE) ×3 IMPLANT
SYR 20ML LL LF (SYRINGE) ×3 IMPLANT
SYR 50ML LL SCALE MARK (SYRINGE) ×3 IMPLANT
TOWEL OR 17X26 10 PK STRL BLUE (TOWEL DISPOSABLE) ×3 IMPLANT
TOWEL OR NON WOVEN STRL DISP B (DISPOSABLE) ×3 IMPLANT
TROCAR ADV FIXATION 5X100MM (TROCAR) ×3 IMPLANT
TROCAR BLADELESS 15MM (ENDOMECHANICALS) ×3 IMPLANT
TROCAR BLADELESS OPT 5 100 (ENDOMECHANICALS) ×3 IMPLANT
TUBE CALIBRATION LAPBAND (TUBING) IMPLANT
TUBING CONNECTING 10 (TUBING) ×2 IMPLANT
TUBING CONNECTING 10' (TUBING) ×1
TUBING ENDO SMARTCAP (MISCELLANEOUS) ×3 IMPLANT

## 2018-11-03 NOTE — Transfer of Care (Signed)
Immediate Anesthesia Transfer of Care Note  Patient: Carolyn Warner  Procedure(s) Performed: LAPAROSCOPIC GASTRIC SLEEVE RESECTION, Upper Endo, ERAS Pathway (N/A Abdomen)  Patient Location: PACU  Anesthesia Type:General  Level of Consciousness: awake, alert  and oriented  Airway & Oxygen Therapy: Patient Spontanous Breathing and Patient connected to face mask oxygen  Post-op Assessment: Report given to RN and Post -op Vital signs reviewed and stable  Post vital signs: Reviewed and stable  Last Vitals:  Vitals Value Taken Time  BP 147/83 11/03/18 0942  Temp    Pulse 75 11/03/18 0943  Resp 28 11/03/18 0943  SpO2 97 % 11/03/18 0943  Vitals shown include unvalidated device data.  Last Pain:  Vitals:   11/03/18 0541  TempSrc: Oral  PainSc: 0-No pain      Patients Stated Pain Goal: 3 (93/26/71 2458)  Complications: No apparent anesthesia complications

## 2018-11-03 NOTE — Op Note (Signed)
Name:  Carolyn Warner MRN: [id number] Date of Surgery: 11/03/2018  Preop Diagnosis:  Morbid Obesity  Postop Diagnosis:  Morbid Obesity (Weight - 122 kg, BMI - 43.5), S/P Gastric Sleeve resection  Procedure:  Upper endoscopy  (Intraoperative)  Surgeon:  Alphonsa Overall, M.D.  Anesthesia:  GET  Indications for procedure: [name] is a 43 y.o. female whose primary care physician is Mikey College, NP and has completed a gastric sleeve resection today for weight loss by Dr. Hassell Done.  I am doing an intraoperative upper endoscopy to evaluate the gastric pouch after the sleeve gastrectomy.  Operative Note: The patient is under general anesthesia.  Dr. Hassell Done is laparoscoping the patient while I do an upper endoscopy to evaluate the stomach pouch.  With the patient intubated, I passed the Olympus upper endoscope without difficulty down the esophagus.  The esophagus was unremarkable.  The esophago-gastric junction was at 39 cm.    The mucosa of the stomach looked viable and the staple line was intact without bleeding.  I advanced the scope to the pylorus, but did not go through it.  While I insufflated the stomach pouch with air, Dr. Hassell Done  flooded the upper abdomen with saline to put the gastric pouch under saline.  There was no bubbling or evidence of a leak.  There was no evidence of narrowing of the pouch and the gastric sleeve looked tubular.  The scope was then withdrawn.  The esophagus was unremarkable and the patient tolerated the endoscopy without difficulty.  Alphonsa Overall, MD, [facility] Surgery Pager: [phone] Office phone:  [phone]

## 2018-11-03 NOTE — Op Note (Signed)
@  date@  November 03, 2018  Surgeon: Kaylyn Lim, MD, FACS  Asst:  Alphonsa Overall, MD, FACS  Anes:  General endotracheal  Procedure: Laparoscopic sleeve gastrectomy and upper endoscopy  Diagnosis: Morbid obesity  Complications: None noted  EBL:   minimal cc  Description of Procedure:  The patient was take to OR 1 and given general anesthesia.  The abdomen was prepped with alcohol chlorohexidine and draped sterilely.  A timeout was performed.  Access to the abdomen was achieved with a 5 mm Optiview without difficulty.  The liver had a fine consistent pattern and no evidence of hepatitis or cirrhosis.  Following insufflation, the state of the abdomen was found to be free of adhesions.  The ViSiGi 36Fr tube was inserted to deflate the stomach and was pulled back into the esophagus.    The pylorus was identified and we measured 5 cm back and marked the antrum.  At that point we began dissection to take down the greater curvature of the stomach using the Harmonic scalpel.  This dissection was taken all the way up to the left crus.  Posterior attachments of the stomach were also taken down.    The ViSiGi tube was then passed into the antrum and suction applied so that it was snug along the lessor curvature.  The "crow's foot" or incisura was identified.  The sleeve gastrectomy was begun using the [employer] stapler beginning with a 4.5 cm black load with TRS and then a 6 cm black with TRS and then with 6 cm purple loads with TRS.  When the sleeve was complete the tube was taken off suction and insufflated briefly.  The tube was withdrawn.  Upper endoscopy was then performed by Dr. Lucia Gaskins and a cylindrical sleeve was seen.     The specimen was extracted through the 15 trocar site using a bag.  Local was provided by infiltrating with Exparel using 30 cc as a TAP block and closed 4-0 Monocryl and Dermabond.    Matt B. Hassell Done, [place], [facility] Surgery, [place]

## 2018-11-03 NOTE — Progress Notes (Signed)
PHARMACY CONSULT FOR:  Risk Assessment for Post-Discharge VTE Following Bariatric Surgery  Post-Discharge VTE Risk Assessment: This patient's probability of 30-day post-discharge VTE is increased due to the factors marked:   Female    Age >/=60 years    BMI >/=50 kg/m2    CHF    Dyspnea at Rest    Paraplegia  X  Non-gastric-band surgery    Operation Time >/=3 hr    Return to OR     Length of Stay >/= 3 d      Hx of VTE   Hypercoagulable condition   Significant venous stasis   Predicted probability of 30-day post-discharge VTE: 0.16%  Other patient-specific factors to consider:   Recommendation for Discharge: No pharmacologic prophylaxis post-discharge   Carolyn Warner is a 43 y.o. female who underwent  Gastric sleeve resection on 11/03/2018   Case start: 0800 Case end: 0930   No Known Allergies  Patient Measurements: Height: 5\' 6"  (167.6 cm) Weight: 270 lb (122.5 kg) IBW/kg (Calculated) : 59.3 Body mass index is 43.58 kg/m.  Recent Labs    11/02/18 1501  WBC 6.8  HGB 14.0  HCT 42.7  PLT 270  CREATININE 0.73  ALBUMIN 4.2  PROT 7.5  AST 61*  ALT 87*  ALKPHOS 60  BILITOT 0.7   Estimated Creatinine Clearance: 122.3 mL/min (by C-G formula based on SCr of 0.73 mg/dL).    Past Medical History:  Diagnosis Date  . Anxiety   . 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 Common migraine with intractable migraine 05/31/2016  . Depression   . Headache   . Thyroid disease      Medications Prior to Admission  Medication Sig Dispense Refill Last Dose  . baclofen (LIORESAL) 10 MG tablet Take 10 mg by mouth 3 (three) times daily.   Past Month at Unknown time  . DULoxetine 40 MG CPEP Take 40 mg by mouth daily. 30 capsule 5 11/03/2018 at 0500  . ibuprofen (ADVIL,MOTRIN) 800 MG tablet Take 1 tablet (800 mg total) by mouth every 8 (eight) hours as needed for mild pain. 75 tablet 2 Past Month at Unknown time  . SYNTHROID 200 MCG tablet Take 1 tablet (200  mcg total) by mouth daily. (Patient taking differently: Take 200 mcg by mouth daily before breakfast. ) 90 tablet 1 11/03/2018 at 0500  . tiZANidine (ZANAFLEX) 4 MG capsule TAKE 1 CAPSULE (4 MG TOTAL) BY MOUTH 3 (THREE) TIMES DAILY AS NEEDED FOR MUSCLE SPASMS. 60 capsule 1 Past Month at Unknown time  . hydrOXYzine (ATARAX/VISTARIL) 10 MG tablet Take 1-2 tablets (10-20 mg total) by mouth 3 (three) times daily as needed for anxiety. 60 tablet 1 Unknown at Unknown time  . levonorgestrel (MIRENA) 20 MCG/24HR IUD by Intrauterine route.   More than a month at Unknown time  . rizatriptan (MAXALT) 10 MG tablet Take 1 tablet at the onset of migraine. May repeat in 2 hours if needed (Patient taking differently: Take 10 mg by mouth See admin instructions. Take 1 tablet at the onset of migraine. May repeat in 2 hours if needed) 10 tablet 11 More than a month at Unknown time       Nyoka Cowden, Adena Sima L 11/03/2018,11:19 AM

## 2018-11-03 NOTE — Discharge Instructions (Signed)
° ° ° °GASTRIC BYPASS/SLEEVE ° Home Care Instructions ° ° These instructions are to help you care for yourself when you go home. ° °Call: If you have any problems. °• Call [phone] and ask for the surgeon on call °• If you need immediate help, come to the ER at .  °• Tell the ER staff that you are a new post-op gastric bypass or gastric sleeve patient °  °Signs and symptoms to report: • Severe vomiting or nausea °o If you cannot keep down clear liquids for longer than 1 day, call your surgeon  °• Abdominal pain that does not get better after taking your pain medication °• Fever over 100.4° F with chills °• Heart beating over 100 beats a minute °• Shortness of breath at rest °• Chest pain °•  Redness, swelling, drainage, or foul odor at incision (surgical) sites °•  If your incisions open or pull apart °• Swelling or pain in calf (lower leg) °• Diarrhea (Loose bowel movements that happen often), frequent watery, uncontrolled bowel movements °• Constipation, (no bowel movements for 3 days) if this happens: Pick one °o Milk of Magnesia, 2 tablespoons by mouth, 3 times a day for 2 days if needed °o Stop taking Milk of Magnesia once you have a bowel movement °o Call your doctor if constipation continues °Or °o Miralax  (instead of Milk of Magnesia) following the label instructions °o Stop taking Miralax once you have a bowel movement °o Call your doctor if constipation continues °• Anything you think is not normal °  °Normal side effects after surgery: • Unable to sleep at night or unable to focus °• Irritability or moody °• Being tearful (crying) or depressed °These are common complaints, possibly related to your anesthesia medications that put you to sleep, stress of surgery, and change in lifestyle.  This usually goes away a few weeks after surgery.  If these feelings continue, call your primary care doctor. °  °Wound Care: You may have surgical glue, steri-strips, or staples over your incisions after  surgery °• Surgical glue:  Looks like a clear film over your incisions and will wear off a little at a time °• Steri-strips: Strips of tape over your incisions. You may notice a yellowish color on the skin under the steri-strips. This is used to make the   steri-strips stick better. Do not pull the steri-strips off - let them fall off °• Staples: Staples may be removed before you leave the hospital °o If you go home with staples, call [facility], [phone] at for an appointment with your surgeon’s nurse to have staples removed 10 days after surgery. °• Showering: You may shower two (2) days after your surgery unless your surgeon tells you differently °o Wash gently around incisions with warm soapy water, rinse well, and gently pat dry  °o No tub baths until staples are removed, steri-strips fall off or glue is gone.  °  °Medications: • Medications should be liquid or crushed if larger than the size of a dime °• Extended release pills (medication that release a little bit at a time through the day) should NOT be crushed or cut. (examples include XL, ER, DR, SR) °• Depending on the size and number of medications you take, you may need to space (take a few throughout the day)/change the time you take your medications so that you do not over-fill your pouch (smaller stomach) °• Make sure you follow-up with your primary care doctor to   make medication changes needed during rapid weight loss and life-style changes °• If you have diabetes, follow up with the doctor that orders your diabetes medication(s) within one week after surgery and check your blood sugar regularly. °• Do not drive while taking prescription pain medication  °• It is ok to take Tylenol by the bottle instructions with your pain medicine or instead of your pain medicine as needed.  DO NOT TAKE NSAIDS (EXAMPLES OF NSAIDS:  IBUPROFREN/ NAPROXEN)  °Diet:                    First 2 Weeks ° You will see the dietician t about two (2) weeks  after your surgery. The dietician will increase the types of foods you can eat if you are handling liquids well: °• If you have severe vomiting or nausea and cannot keep down clear liquids lasting longer than 1 day, call your surgeon @ ([phone]) °Protein Shake °• Drink at least 2 ounces of shake 5-6 times per day °• Each serving of protein shakes (usually 8 - 12 ounces) should have: °o 15 grams of protein  °o And no more than 5 grams of carbohydrate  °• Goal for protein each day: °o Men = 80 grams per day °o Women = 60 grams per day °• Protein powder may be added to fluids such as non-fat milk or Lactaid milk or unsweetened Soy/Almond milk (limit to 35 grams added protein powder per serving) ° °Hydration °• Slowly increase the amount of water and other clear liquids as tolerated (See Acceptable Fluids) °• Slowly increase the amount of protein shake as tolerated  °•  Sip fluids slowly and throughout the day.  Do not use straws. °• May use sugar substitutes in small amounts (no more than 6 - 8 packets per day; i.e. Splenda) ° °Fluid Goal °• The first goal is to drink at least 8 ounces of protein shake/drink per day (or as directed by the nutritionist); some examples of protein shakes are Syntrax Nectar, Adkins Advantage, EAS Edge HP, and Unjury. See handout from pre-op Bariatric Education Class: °o Slowly increase the amount of protein shake you drink as tolerated °o You may find it easier to slowly sip shakes throughout the day °o It is important to get your proteins in first °• Your fluid goal is to drink 64 - 100 ounces of fluid daily °o It may take a few weeks to build up to this °• 32 oz (or more) should be clear liquids  °And  °• 32 oz (or more) should be full liquids (see below for examples) °• Liquids should not contain sugar, caffeine, or carbonation ° °Clear Liquids: °• Water or Sugar-free flavored water (i.e. Fruit H2O, Propel) °• Decaffeinated coffee or tea (sugar-free) °• Crystal Lite, Wyler’s Lite,  Minute Maid Lite °• Sugar-free Jell-O °• Bouillon or broth °• Sugar-free Popsicle:   *Less than 20 calories each; Limit 1 per day ° °Full Liquids: °Protein Shakes/Drinks + 2 choices per day of other full liquids °• Full liquids must be: °o No More Than 15 grams of Carbs per serving  °o No More Than 3 grams of Fat per serving °• Strained low-fat cream soup (except Cream of Potato or Tomato) °• Non-Fat milk °• Fat-free Lactaid Milk °• Unsweetened Soy Or Unsweetened Almond Milk °• Low Sugar yogurt (Dannon Lite & Fit, Greek yogurt; Oikos Triple Zero; Chobani Simply 100; Yoplait 100 calorie Greek - No Fruit on the Bottom) ° °  °Vitamins   and Minerals • Start 1 day after surgery unless otherwise directed by your surgeon °• 2 Chewable Bariatric Specific Multivitamin / Multimineral Supplement with iron (Example: Bariatric Advantage Multi EA) °• Chewable Calcium with Vitamin D-3 °(Example: 3 Chewable Calcium Plus 600 with Vitamin D-3) °o Take 500 mg three (3) times a day for a total of 1500 mg each day °o Do not take all 3 doses of calcium at one time as it may cause constipation, and you can only absorb 500 mg  at a time  °o Do not mix multivitamins containing iron with calcium supplements; take 2 hours apart °• Menstruating women and those with a history of anemia (a blood disease that causes weakness) may need extra iron °o Talk with your doctor to see if you need more iron °• Do not stop taking or change any vitamins or minerals until you talk to your dietitian or surgeon °• Your Dietitian and/or surgeon must approve all vitamin and mineral supplements °  °Activity and Exercise: Limit your physical activity as instructed by your doctor.  It is important to continue walking at home.  During this time, use these guidelines: °• Do not lift anything greater than ten (10) pounds for at least two (2) weeks °• Do not go back to work or drive until your surgeon says you can °• You may have sex when you feel comfortable  °o It is  VERY important for female patients to use a reliable birth control method; fertility often increases after surgery  °o All hormonal birth control will be ineffective for 30 days after surgery due to medications given during surgery a barrier method must be used. °o Do not get pregnant for at least 18 months °• Start exercising as soon as your doctor tells you that you can °o Make sure your doctor approves any physical activity °• Start with a simple walking program °• Walk 5-15 minutes each day, 7 days per week.  °• Slowly increase until you are walking 30-45 minutes per day °Consider joining our BELT program. [phone] or email belt@[employer] °  °Special Instructions Things to remember: °• Use your CPAP when sleeping if this applies to you ° °• [facility] has two free Bariatric Surgery Support Groups that meet monthly °o The 3rd Thursday of each month, 6 pm, [facility]  °o The 2nd Friday of each month, 11:45 am in the private dining room in the basement of [place] °• It is very important to keep all follow up appointments with your surgeon, dietitian, primary care physician, and behavioral health practitioner °• Routine follow up schedule with your surgeon include appointments at 2-3 weeks, 6-8 weeks, 6 months, and 1 year at a minimum.  Your surgeon may request to see you more often.   °o After the first year, please follow up with your bariatric surgeon and dietitian at least once a year in order to maintain best weight loss results °[facility]: [phone] °[facility]: [phone] °Bariatric Nurse Coordinator: [phone] °  °   Reviewed and Endorsed  °by [facility] Patient Education Committee, June, 2016 °Edits Approved: Aug, 2018 ° ° ° °

## 2018-11-03 NOTE — Interval H&P Note (Signed)
History and Physical Interval Note:  11/03/2018 7:11 AM  Carolyn Warner  has presented today for surgery, with the diagnosis of Morbid Obesity.  The various methods of treatment have been discussed with the patient and family. After consideration of risks, benefits and other options for treatment, the patient has consented to  Procedure(s): LAPAROSCOPIC GASTRIC SLEEVE RESECTION, Upper Endo, ERAS Pathway (N/A) as a surgical intervention.  The patient's history has been reviewed, patient examined, no change in status, stable for surgery.  I have reviewed the patient's chart and labs.  Questions were answered to the patient's satisfaction.     [name]

## 2018-11-03 NOTE — Anesthesia Postprocedure Evaluation (Signed)
Anesthesia Post Note  Patient: Carolyn Warner  Procedure(s) Performed: LAPAROSCOPIC GASTRIC SLEEVE RESECTION, Upper Endo, ERAS Pathway (N/A Abdomen)     Patient location during evaluation: PACU Anesthesia Type: General Level of consciousness: awake and alert, oriented and patient cooperative Pain management: pain level controlled Vital Signs Assessment: post-procedure vital signs reviewed and stable Respiratory status: spontaneous breathing, nonlabored ventilation and respiratory function stable Cardiovascular status: blood pressure returned to baseline and stable Postop Assessment: no apparent nausea or vomiting Anesthetic complications: no    Last Vitals:  Vitals:   11/03/18 1152 11/03/18 1331  BP: (!) 149/95 (!) 151/85  Pulse: 68 69  Resp:  17  Temp:  36.4 C  SpO2:  99%    Last Pain:  Vitals:   11/03/18 1331  TempSrc: Oral  PainSc:                  Lexii Walsh,E. Jaryn Rosko

## 2018-11-03 NOTE — Anesthesia Procedure Notes (Signed)
Procedure Name: Intubation Date/Time: 11/03/2018 7:35 AM Performed by: Jetta Murray D, CRNA Pre-anesthesia Checklist: Patient identified, Emergency Drugs available, Suction available and Patient being monitored Patient Re-evaluated:Patient Re-evaluated prior to induction Oxygen Delivery Method: Circle system utilized Preoxygenation: Pre-oxygenation with 100% oxygen Induction Type: IV induction Ventilation: Oral airway inserted - appropriate to patient size Laryngoscope Size: Mac and 3 Grade View: Grade I Tube type: Oral Tube size: 7.0 mm Number of attempts: 1 Airway Equipment and Method: Stylet Placement Confirmation: ETT inserted through vocal cords under direct vision,  positive ETCO2,  CO2 detector and breath sounds checked- equal and bilateral Tube secured with: Tape Dental Injury: Teeth and Oropharynx as per pre-operative assessment

## 2018-11-03 NOTE — Progress Notes (Signed)
Discussed post op day goals with patient including ambulation, IS, diet progression, pain, and nausea control.  BSTOP education provided including BSTOP information guide, "Guide for Pain Management after your Bariatric Procedure".  Questions answered. 

## 2018-11-03 NOTE — Progress Notes (Signed)
Patient with complaints of migraine, discussed with Dr Hassell Done verbal order given for home medication.  Home mediciation Maxalt 10 mg not on formulary discussed substitution with Dorian Pod in pharmacy who indicated the substitution would be Imitrex 100 mg.  Relayed information to Dr Hassell Done. Order placed.

## 2018-11-04 ENCOUNTER — Encounter (HOSPITAL_COMMUNITY): Payer: Self-pay | Admitting: Surgery

## 2018-11-04 LAB — CBC WITH DIFFERENTIAL/PLATELET
Abs Immature Granulocytes: 0.04 10*3/uL (ref 0.00–0.07)
Basophils Absolute: 0 10*3/uL (ref 0.0–0.1)
Basophils Relative: 0 %
Eosinophils Absolute: 0 10*3/uL (ref 0.0–0.5)
Eosinophils Relative: 0 %
HCT: 42.8 % (ref 36.0–46.0)
Hemoglobin: 14.1 g/dL (ref 12.0–15.0)
Immature Granulocytes: 0 %
Lymphocytes Relative: 13 %
Lymphs Abs: 1.5 10*3/uL (ref 0.7–4.0)
MCH: 30.3 pg (ref 26.0–34.0)
MCHC: 32.9 g/dL (ref 30.0–36.0)
MCV: 91.8 fL (ref 80.0–100.0)
Monocytes Absolute: 0.8 10*3/uL (ref 0.1–1.0)
Monocytes Relative: 7 %
Neutro Abs: 8.9 10*3/uL — ABNORMAL HIGH (ref 1.7–7.7)
Neutrophils Relative %: 80 %
Platelets: 280 10*3/uL (ref 150–400)
RBC: 4.66 MIL/uL (ref 3.87–5.11)
RDW: 13.2 % (ref 11.5–15.5)
WBC: 11.2 10*3/uL — ABNORMAL HIGH (ref 4.0–10.5)
nRBC: 0 % (ref 0.0–0.2)

## 2018-11-04 MED ORDER — SUMATRIPTAN SUCCINATE 50 MG PO TABS
50.0000 mg | ORAL_TABLET | ORAL | Status: DC | PRN
  Filled 2018-11-04: qty 1

## 2018-11-04 MED ORDER — METOCLOPRAMIDE HCL 5 MG/ML IJ SOLN
5.0000 mg | Freq: Three times a day (TID) | INTRAMUSCULAR | Status: DC | PRN
  Administered 2018-11-04: 16:00:00 5 mg via INTRAVENOUS
  Filled 2018-11-04: qty 2

## 2018-11-04 MED ORDER — DULOXETINE HCL 20 MG PO CPEP
40.0000 mg | ORAL_CAPSULE | Freq: Every day | ORAL | Status: DC
  Filled 2018-11-04 (×3): qty 2

## 2018-11-04 MED ORDER — HYDROXYZINE HCL 10 MG PO TABS
10.0000 mg | ORAL_TABLET | Freq: Three times a day (TID) | ORAL | Status: DC | PRN
  Filled 2018-11-04: qty 2

## 2018-11-04 MED ORDER — TIZANIDINE HCL 4 MG PO TABS: 4.0000 mg | ORAL_TABLET | Freq: Three times a day (TID) | ORAL | Status: DC | PRN

## 2018-11-04 NOTE — Progress Notes (Signed)
Patient alert and oriented, Post op day 1.  Provided support and encouragement.  Encouraged pulmonary toilet, ambulation and small sips of liquids.  Started chicken soup unjury, patient did not tolerate milky protein. All questions answered.  Will continue to monitor.

## 2018-11-04 NOTE — Progress Notes (Signed)
Patient ID: Carolyn Warner, female   DOB: 1975/05/13, 43 y.o.   MRN: [id number] [facility] Surgery Progress Note:   1 Day Post-Op  Subjective: Mental status is clear.  No complaints except her BP is higher than usual Objective: Vital signs in last 24 hours: Temp:  [97.6 F (36.4 C)-98.3 F (36.8 C)] 97.7 F (36.5 C) (09/02 1356) Pulse Rate:  [58-70] 70 (09/02 1356) Resp:  [16-20] 17 (09/02 1356) BP: (130-161)/(76-98) 130/82 (09/02 1507) SpO2:  [97 %-100 %] 100 % (09/02 1356)  Intake/Output from previous day: 09/01 0701 - 09/02 0700 In: 3205.9 [P.O.:390; I.V.:2635.9] Out: 1370 [Urine:1350; Blood:20] Intake/Output this shift: Total I/O In: 581.3 [I.V.:581.3] Out: 300 [Urine:300]  Physical Exam: Work of breathing is normal.  Incisions OK  Lab Results:  Results for orders placed or performed during the hospital encounter of 11/03/18 (from the past 48 hour(s))  Pregnancy, urine STAT morning of surgery     Status: None   Collection Time: 11/03/18  5:34 AM  Result Value Ref Range   Preg Test, Ur NEGATIVE NEGATIVE    Comment:        THE SENSITIVITY OF THIS METHODOLOGY IS >20 mIU/mL. Performed at [facility], [place] [address], [place], [place] [address]   Hemoglobin and hematocrit, blood     Status: None   Collection Time: 11/03/18  2:02 PM  Result Value Ref Range   Hemoglobin 14.7 12.0 - 15.0 g/dL   HCT 44.4 36.0 - 46.0 %    Comment: Performed at [facility], [place] [address], [place], [place] [address]  CBC WITH DIFFERENTIAL     Status: Abnormal   Collection Time: 11/04/18  3:36 AM  Result Value Ref Range   WBC 11.2 (H) 4.0 - 10.5 K/uL   RBC 4.66 3.87 - 5.11 MIL/uL   Hemoglobin 14.1 12.0 - 15.0 g/dL   HCT 42.8 36.0 - 46.0 %   MCV 91.8 80.0 - 100.0 fL   MCH 30.3 26.0 - 34.0 pg   MCHC 32.9 30.0 - 36.0 g/dL   RDW 13.2 11.5 - 15.5 %   Platelets 280 150 - 400 K/uL   nRBC 0.0 0.0 - 0.2 %   Neutrophils Relative % 80 %   Neutro  Abs 8.9 (H) 1.7 - 7.7 K/uL   Lymphocytes Relative 13 %   Lymphs Abs 1.5 0.7 - 4.0 K/uL   Monocytes Relative 7 %   Monocytes Absolute 0.8 0.1 - 1.0 K/uL   Eosinophils Relative 0 %   Eosinophils Absolute 0.0 0.0 - 0.5 K/uL   Basophils Relative 0 %   Basophils Absolute 0.0 0.0 - 0.1 K/uL   Immature Granulocytes 0 %   Abs Immature Granulocytes 0.04 0.00 - 0.07 K/uL    Comment: Performed at [facility], [place] [address], [place], [place] [address]    Radiology/Results: No results found.  Anti-infectives: Anti-infectives (From admission, onward)   Start     Dose/Rate Route Frequency Ordered Stop   11/03/18 0600  cefoTEtan (CEFOTAN) 2 g in sodium chloride 0.9 % 100 mL IVPB     2 g 200 mL/hr over 30 Minutes Intravenous On call to O.R. 11/03/18 0533 11/03/18 0737      Assessment/Plan: Problem List: Patient Active Problem List   Diagnosis Date Noted  . 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 Chronic radicular lumbar pain 10/05/2018  . Class 3 severe obesity without serious comorbidity with body mass index (BMI) of  40.0 to 44.9 in adult [facility](HCC) 10/05/2018  . Osteoarthritis of spine with radiculopathy, lumbar region 10/05/2018  . Chronic pain syndrome 10/05/2018  . Chronic bilateral low back pain with bilateral sciatica 06/17/2018  . 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 Acquired hypothyroidism 03/16/2018  . Common migraine with intractable migraine 05/31/2016    Going slowly on liquids.  Not ready for discharge today.   1 Day Post-Op    LOS: 1 day   Matt B. Daphine DeutscherMartin, MD, [facility]  [facility] [phone] beeper [phone]  11/04/2018 4:03 PM

## 2018-11-04 NOTE — Progress Notes (Signed)
Nutrition Note  RD consulted for diet education for patient s/p bariatric surgery. While RDs are working remotely, Bariatric nurse coordinator providing education.   If nutrition issues arise, please consult RD.   Jaelynne Hockley, MS, RD, LDN [facility] Clinical Dietitian Pager: [phone] After Hours Pager: [phone]   

## 2018-11-04 NOTE — Progress Notes (Signed)
Pt still very nauseated, tried protein but does not think she can handle it at this time.

## 2018-11-04 NOTE — Progress Notes (Signed)
Pt can tolerate water but the protein seems to make her nauseous.  Zofran helps a little.  Pt states that as soon as she swallows the protein, it feels like it wants to come back up.

## 2018-11-05 LAB — CBC WITH DIFFERENTIAL/PLATELET
Abs Immature Granulocytes: 0.04 10*3/uL (ref 0.00–0.07)
Basophils Absolute: 0 10*3/uL (ref 0.0–0.1)
Basophils Relative: 0 %
Eosinophils Absolute: 0 10*3/uL (ref 0.0–0.5)
Eosinophils Relative: 0 %
HCT: 42.7 % (ref 36.0–46.0)
Hemoglobin: 13.9 g/dL (ref 12.0–15.0)
Immature Granulocytes: 0 %
Lymphocytes Relative: 20 %
Lymphs Abs: 1.8 10*3/uL (ref 0.7–4.0)
MCH: 30.4 pg (ref 26.0–34.0)
MCHC: 32.6 g/dL (ref 30.0–36.0)
MCV: 93.4 fL (ref 80.0–100.0)
Monocytes Absolute: 0.7 10*3/uL (ref 0.1–1.0)
Monocytes Relative: 8 %
Neutro Abs: 6.6 10*3/uL (ref 1.7–7.7)
Neutrophils Relative %: 72 %
Platelets: 270 10*3/uL (ref 150–400)
RBC: 4.57 MIL/uL (ref 3.87–5.11)
RDW: 13.4 % (ref 11.5–15.5)
WBC: 9.1 10*3/uL (ref 4.0–10.5)
nRBC: 0 % (ref 0.0–0.2)

## 2018-11-05 MED ORDER — GABAPENTIN 100 MG PO CAPS: 200.0000 mg | ORAL_CAPSULE | Freq: Two times a day (BID) | ORAL | 0 refills | Status: DC

## 2018-11-05 MED ORDER — PANTOPRAZOLE SODIUM 40 MG PO TBEC: 40.0000 mg | DELAYED_RELEASE_TABLET | Freq: Every day | ORAL | 0 refills | Status: DC

## 2018-11-05 MED ORDER — ACETAMINOPHEN 500 MG PO TABS: 1000.0000 mg | ORAL_TABLET | Freq: Three times a day (TID) | ORAL | 0 refills | Status: AC

## 2018-11-05 MED ORDER — ONDANSETRON 4 MG PO TBDP: 4.0000 mg | ORAL_TABLET | Freq: Four times a day (QID) | ORAL | 0 refills | Status: DC | PRN

## 2018-11-05 NOTE — Plan of Care (Signed)
Patient sitting edge of bed this morning; states pain and nausea controlled at this time. No needs expressed; anticipating discharge later today. Will continue to monitor.

## 2018-11-05 NOTE — Plan of Care (Signed)
Reviewed discharge instructions with patient; copy given. IV removed. Patient ready for discharge.  

## 2018-11-05 NOTE — Progress Notes (Signed)
Patient alert and oriented, pain is controlled. Patient is tolerating fluids, advanced to protein shake today, patient is tolerating well.  Reviewed Gastric sleeve discharge instructions with patient and patient is able to articulate understanding.  Provided information on BELT program, Support Group and [facility] outpatient pharmacy. All questions answered, will continue to monitor.  Total fluid intake 1080 Per dehydration  Protocol call back one week post op

## 2018-11-05 NOTE — Progress Notes (Signed)
Patient alert and oriented, Post op day 2.  Provided support and encouragement.  Encouraged pulmonary toilet, ambulation and small sips of liquids.  Sitting in chair denies pain/nausea.  All questions answered.  Will continue to monitor.

## 2018-11-05 NOTE — Discharge Summary (Signed)
Physician Discharge Summary  Patient ID: Carolyn AlamoKelly Story Bai MRN: [id number] DOB/AGE: 42-Jun-1977 42 y.o.  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  Was slower to progress with liquids.  Ready for discharge on PD 2   Consults: none  Significant Diagnostic Studies: none    Discharge Exam: Blood pressure (!) 144/80, pulse 68, temperature 98.7 F (37.1 C), temperature source Oral, resp. rate 18, height 5\' 6"  (1.676 m), weight 122.5 kg, SpO2 95 %. Incisions oK.  Not flushed in the face today  Disposition: Discharge disposition: 01-Home or Self Care       Discharge Instructions    Ambulate hourly while awake   Complete by: As directed    Call MD for:  difficulty breathing, headache or visual disturbances   Complete by: As directed    Call MD for:  persistant dizziness or light-headedness   Complete by: As directed    Call MD for:  persistant nausea and vomiting   Complete by: As directed    Call MD for:  redness, tenderness, or signs of infection (pain, swelling, redness, odor or green/yellow discharge around incision site)   Complete by: As directed    Call MD for:  severe uncontrolled pain   Complete by: As directed    Call MD for:  temperature >101 F   Complete by: As directed    Diet bariatric full liquid   Complete by: As directed    Incentive spirometry   Complete by: As directed    Perform hourly while awake     Allergies as of 11/05/2018   No Known Allergies     Medication List    TAKE these medications   acetaminophen 500 MG tablet Commonly known as: TYLENOL Take 2 tablets (1,000 mg total) by mouth every 8 (eight) hours for 5 days.   baclofen 10 MG tablet Commonly  known as: LIORESAL Take 10 mg by mouth 3 (three) times daily as needed for muscle spasms.   DULoxetine HCl 40 MG Cpep Take 40 mg by mouth daily.   gabapentin 100 MG capsule Commonly known as: NEURONTIN Take 2 capsules (200 mg total) by mouth every 12 (twelve) hours.   hydrOXYzine 10 MG tablet Commonly known as: ATARAX/VISTARIL Take 1-2 tablets (10-20 mg total) by mouth 3 (three) times daily as needed for anxiety.   ibuprofen 800 MG tablet Commonly known as: ADVIL Take 1 tablet (800 mg total) by mouth every 8 (eight) hours as needed for mild pain. Notes to patient: Avoid NSAIDs for 6-8 weeks after surgery   levonorgestrel 20 MCG/24HR IUD Commonly known as: MIRENA by Intrauterine route.   ondansetron 4 MG disintegrating tablet Commonly known as: ZOFRAN-ODT Take 1 tablet (4 mg total) by mouth every 6 (six) hours as needed for nausea or vomiting.   pantoprazole 40 MG tablet Commonly known as: PROTONIX Take 1 tablet (40 mg total) by mouth daily.   rizatriptan 10 MG tablet Commonly known as: MAXALT Take 1 tablet at the onset of migraine. May repeat in 2 hours if needed What changed:   how much to take  how to take this  when to take this   Synthroid 200 MCG tablet Generic drug: levothyroxine Take 1 tablet (200 mcg  total) by mouth daily. What changed: when to take this   tiZANidine 4 MG capsule Commonly known as: ZANAFLEX TAKE 1 CAPSULE (4 MG TOTAL) BY MOUTH 3 (THREE) TIMES DAILY AS NEEDED FOR MUSCLE SPASMS.      Follow-up Information    [facility], [place]. Go on 11/25/2018.   Specialty: General Surgery Why: at 9 am Contact information: [address] [place] [address] [phone]        Surgery, [place] .   Specialty: General Surgery Contact information: [address] [place] [address] [phone]           Signed: [name] 11/05/2018, 11:55 AM

## 2018-11-10 ENCOUNTER — Telehealth (HOSPITAL_COMMUNITY): Payer: Self-pay

## 2018-11-10 NOTE — Telephone Encounter (Addendum)
.  Patient called to discuss post bariatric surgery follow up questions. Left voice message with contact information 11/10/2018 1157 am,  Await return call See below:   1.  Tell me about your pain and pain management?denies  2.  Let's talk about fluid intake.  How much total fluid are you taking in?42 ounces  3.  How much protein have you taken in the last 2 days?45 grams   4.  Have you had nausea?  Tell me about when have experienced nausea and what you did to help?1st day without Zofran was Sunday 11/09/2018  5.  Has the frequency or color changed with your urine?urine clear no problem  6.  Tell me what your incisions look like?no problems  7.  Have you been passing gas? BM?had BM  sat and sun, discussed constipation  8.  If a problem or question were to arise who would you call?  Do you know contact numbers for [facility], [facility], and [facility]?aware of how to contact all services  9.  How has the walking going?walking regulary took a 27 minutes walk Saturday and felt good  10.  How are your vitamins and calcium going?  How are you taking them?MVI once per day in the evening due to synthroid,  Taking 3 calcium, notices need something on stomach with MVi.  Discussed that the iron can often cause stomach upset when stomach empty.  Plan made

## 2018-11-18 ENCOUNTER — Encounter: Payer: Self-pay | Admitting: Dietician

## 2018-11-18 ENCOUNTER — Encounter: Payer: BC Managed Care – PPO | Attending: Surgery | Admitting: Dietician

## 2018-11-18 ENCOUNTER — Other Ambulatory Visit: Payer: Self-pay

## 2018-11-18 VITALS — Ht 66.0 in | Wt 249.6 lb

## 2018-11-18 DIAGNOSIS — Z6841 Body Mass Index (BMI) 40.0 and over, adult: Secondary | ICD-10-CM | POA: Diagnosis not present

## 2018-11-18 NOTE — Patient Instructions (Signed)
   Begin adding solid protein foods at least 3x a day; start with 1oz or 1/4 cup portions, allow 30 minutes to eat, and avoid fluids 15 min before, during and 30 min after eating. Chew thoroughly and take small bites.   Use protein drinks as back-up nutrition source to help reach your daily goals.   Continue to gradually increase daily exercise, great job!

## 2018-11-18 NOTE — Progress Notes (Signed)
Follow-up visit:  2 Weeks Post-Operative Sleeve Gastrectomy Surgery  Medical Nutrition Therapy:  Appt start time: 1500 end time:  4481.  Primary concerns today: Post-operative Bariatric Surgery Nutrition Management.  Preferred Learning Style:   Visual  Hands on    Learning Readiness:   Change in progress  Weight: 249.6lbs Height: 5'6" Weight at previous NDES visit: 271.6lbs Weight loss of 22lbs since 08/21/18    Progress:  Patient reports severe nausea and headache when coming out of anesthesia after surgery, stayed in hospital one extra night; but no ongoing issues with nausea or headache.  She reports difficulty drinking protein drinks in the past few days due to burnout; she is ready to resume eating solid foods.  Patient also has some nausea when taking multivitamin with iron; she is looking for a lower-iron alternative. She also had nausea when taking prenatal vitamins  She has been eating or drinking something every 1-2 hours since surgery. Sometimes takes up to 1 hour to finish 1 protein drink.   Dietary recall: Breakfast: Premier Protein drink Snack: same as pam  Lunch: Premier Protein drink tried with unsweetened soy milk today and enjoyed it more, able to drink Snack: sugar free jello, sugar free pudding, light n fit yogurt, soup broth (chicken noodle, brocc cheddar)  Dinner: Protein drink or soup broth or yogurt Snack: sf popsicles or same as pm  Fluid intake: 32-40oz water + small amount gatorade +jello, popsicle + protein shakes = 64oz + daily Estimated total protein intake: 50-60g daily  Medications: DULoxetine, levonorgestrel IUD, Sythroid  Supplementation: bariatric multivitamin 2x daily + calcium 3x daily  Using straws: no Drinking while eating: no Hair loss:  Carbonated beverages: no N/V/D/C: some nausea after taking multivitamin with iron; no V/D/C Dumping syndrome: no   Recent physical activity:  Walking 1-2 miles daily  Progress Towards  Goal(s):  In progress.  Handouts given during visit include:  Phase 3 and 4 bariatric diet handouts   Nutritional Diagnosis:  [place]-3.3 Overweight/obesity As related to history of excess calories and inactivity.  As evidenced by patient with current BMI of 40.3, following bariatric diet guidelines following sleeve gastrectomy.    Intervention:    Reviewed patient's progress since surgery.  Instructed on reintroduction of solid protein foods followed by low-carb vegetables.   Discussed various ways to add protein to foods.   Reviewed importance of maintaining adequate protein and fluid intake during dietary transition; encouraged monitoring intake.   Discussed use of bariatric phone app for additional help and food ideas.   Commended patient for progress with exercise and adherence to diet guidelines.  Teaching Method Utilized:  Visual Auditory Hands on  Barriers to learning/adherence to lifestyle change: none  Demonstrated degree of understanding via:  Teach Back   Monitoring/Evaluation:  Dietary intake, exercise, and body weight. Follow up in 6 weeks for 2 month post-op visit.

## 2018-12-01 ENCOUNTER — Other Ambulatory Visit: Payer: Self-pay | Admitting: Nurse Practitioner

## 2018-12-01 DIAGNOSIS — E039 Hypothyroidism, unspecified: Secondary | ICD-10-CM

## 2018-12-11 ENCOUNTER — Other Ambulatory Visit: Payer: Self-pay

## 2018-12-11 ENCOUNTER — Encounter: Payer: Self-pay | Admitting: Nurse Practitioner

## 2018-12-11 ENCOUNTER — Ambulatory Visit (INDEPENDENT_AMBULATORY_CARE_PROVIDER_SITE_OTHER): Payer: BC Managed Care – PPO | Admitting: Nurse Practitioner

## 2018-12-11 VITALS — BP 116/66 | HR 89 | Ht 66.0 in | Wt 244.8 lb

## 2018-12-11 DIAGNOSIS — E039 Hypothyroidism, unspecified: Secondary | ICD-10-CM

## 2018-12-11 DIAGNOSIS — M5442 Lumbago with sciatica, left side: Secondary | ICD-10-CM

## 2018-12-11 DIAGNOSIS — M5441 Lumbago with sciatica, right side: Secondary | ICD-10-CM

## 2018-12-11 DIAGNOSIS — G8929 Other chronic pain: Secondary | ICD-10-CM

## 2018-12-11 DIAGNOSIS — F418 Other specified anxiety disorders: Secondary | ICD-10-CM

## 2018-12-11 LAB — T4, FREE: Free T4: 1.6 ng/dL (ref 0.8–1.8)

## 2018-12-11 LAB — TSH: TSH: 0.04 mIU/L — ABNORMAL LOW

## 2018-12-11 MED ORDER — DULOXETINE HCL 20 MG PO CPEP: 20.0000 mg | ORAL_CAPSULE | Freq: Every day | ORAL | 3 refills | Status: DC
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
## 2018-12-11 NOTE — Patient Instructions (Addendum)
[place],   Thank you for coming in to clinic today.  1. REDUCE duloxetine to 20 mg once daily  2. Counseling / Therapy may be best:  Internal Referral: [facility], [place]  Self Referral: 1. [facility] [place] [facility]   Address: [address], [place], [place] [address] Hours: Open today  9AM-7PM Phone: [phone]  2. [place], [place] Address: [address] [place], [place], [place] [address] Phone: [phone]  3. Thyroid labs now - Repeat labs every 3 months during weight loss.  Please schedule a follow-up appointment. Return in about 2 months (around 02/10/2019) for anxiety.  If you have any other questions or concerns, please feel free to call the clinic or send a message through [place]. You may also schedule an earlier appointment if necessary.  You will receive a survey after today's visit either digitally by e-mail or paper by [employer]. Your experiences and feedback matter to [place].  Please respond so we know how we are doing as we provide care for you.  Cassell Smiles, DNP, AGNP-BC Adult Gerontology Nurse Practitioner [place]

## 2018-12-16 ENCOUNTER — Other Ambulatory Visit: Payer: Self-pay | Admitting: Nurse Practitioner

## 2018-12-16 DIAGNOSIS — E039 Hypothyroidism, unspecified: Secondary | ICD-10-CM

## 2018-12-16 MED ORDER — LEVOTHYROXINE SODIUM 175 MCG PO TABS: 175.0000 ug | ORAL_TABLET | Freq: Every day | ORAL | 5 refills | Status: DC

## 2018-12-17 ENCOUNTER — Encounter: Payer: Self-pay | Admitting: Nurse Practitioner

## 2018-12-24 ENCOUNTER — Other Ambulatory Visit: Payer: Self-pay | Admitting: Nurse Practitioner

## 2018-12-24 DIAGNOSIS — G8929 Other chronic pain: Secondary | ICD-10-CM

## 2018-12-24 DIAGNOSIS — M5441 Lumbago with sciatica, right side: Secondary | ICD-10-CM

## 2018-12-24 DIAGNOSIS — F418 Other specified anxiety disorders: Secondary | ICD-10-CM

## 2018-12-24 DIAGNOSIS — M5442 Lumbago with sciatica, left side: Secondary | ICD-10-CM

## 2018-12-30 ENCOUNTER — Encounter: Payer: BC Managed Care – PPO | Attending: Surgery | Admitting: Dietician

## 2018-12-30 ENCOUNTER — Encounter: Payer: Self-pay | Admitting: Dietician

## 2018-12-30 ENCOUNTER — Other Ambulatory Visit: Payer: Self-pay

## 2018-12-30 VITALS — Ht 66.0 in | Wt 233.5 lb

## 2018-12-30 DIAGNOSIS — E6609 Other obesity due to excess calories: Secondary | ICD-10-CM

## 2018-12-30 DIAGNOSIS — Z6837 Body mass index (BMI) 37.0-37.9, adult: Secondary | ICD-10-CM

## 2018-12-30 NOTE — Patient Instructions (Signed)
   Great job meeting protein and fluid goals, keep up the awesome work!  Continue to gradually add more variety of vegetables, begin trying some peas, sweet potato, or low-sugar fruits as you are able to eat more volume of food at a sitting.  Keep up regular exercise to maintain muscle strength and healthy metabolism.

## 2018-12-30 NOTE — Progress Notes (Signed)
Follow-up visit:  2 Months Post-Operative Sleeve Gastrectomy Surgery  Medical Nutrition Therapy:  Appt start time: 1100 end time:  1130.  Primary concerns today: Post-operative Bariatric Surgery Nutrition Management.  Preferred Learning Style:   Visual  Hands on    Learning Readiness:   Change in progress  Weight: 233.5lbs Height: 5'6" %body fat:  49.8 (down from 51.6%) Weight at previous NDES visit: 249.6lbs  Progress:    Patient reports reduced pain in back, able to exercise much more easily now.   She has not been weighing regularly at home to avoid becoming overly anxious with weight numbers. Has reduced clothing size.   She is using [employer] app for help with food tracking  She denies any concerns or adverse symptoms at this time.   Dietary recall: Breakfast: protein shake often (easiest); sometimes eggs; occasionally starbucks egg bites (muffin-size egg blend) -- eats one  Snack: same as pm  Lunch: 1oz chicken or other meat + 1oz green beans, brocc and cheese, pinto beans Snack: 4-4:30pm-- quest protein bar; string cheese; yogurt; sugar free jello  Dinner: 7-8pm-- 1oz meat + 1oz veg. Snack: none  Fluid intake: 36-48oz water rm temp + coffee in am 16oz + 16oz protein shake Estimated total protein intake: 65-75g daily  Medications: DULoxetine, levonorgestrel IUD, levothyroxine, rizatriptan prn Supplementation: bariatric multivitamin 2x daily + calcium 3x daily  Using straws: no Drinking while eating: no Hair loss: no; has some due to thyroid function Carbonated beverages: no N/V/D/C: none since reduced cymbalta dose Dumping syndrome: no   Recent physical activity:  Walking (longerst was day hike for about 5hrs) usu 45 minutes 4-5x a week; has resistance bands and has begun sporadically adding some resistance training,  Progress Towards Goal(s):  In progress.  Handouts given during visit include:  Phase 5 and 6 bariatric diet handouts  Goals and  Instructions    Nutritional Diagnosis:  [place]-3.3 Overweight/obesity As related to history of excess calories and inadequate physical activity.  As evidenced by patient with current BMI of 37.7, following bariatric diet guidelines for ongoing weight loss after sleeve gastrectomy.    Intervention:    Reviewed progress since previous visit.  Patient continues to follow bariatric diet guidelines closely and is losing weight steadily.   Discussed further diet advancement in a gradual manner, emphasizing protein source first.   Discussed planning ahead for holiday or special occasion meals and encouraged use of bariatric online resources for low fat, low sugar/ carb options to enjoy.   Patient will plan to return for 55-month follow-up; she will schedule closer to that time when she knows her work schedule.   Teaching Method Utilized:  Visual Auditory   Barriers to learning/adherence to lifestyle change: none  Demonstrated degree of understanding via:  Teach Back   Monitoring/Evaluation:  Dietary intake, exercise, and body weight. Follow up in 4 months for 6 month post-op visit.

## 2019-04-22 ENCOUNTER — Ambulatory Visit: Payer: BC Managed Care – PPO | Admitting: Family Medicine

## 2019-05-06 ENCOUNTER — Ambulatory Visit (INDEPENDENT_AMBULATORY_CARE_PROVIDER_SITE_OTHER): Payer: BC Managed Care – PPO | Admitting: Family Medicine

## 2019-05-06 ENCOUNTER — Other Ambulatory Visit: Payer: Self-pay

## 2019-05-06 ENCOUNTER — Encounter: Payer: Self-pay | Admitting: Family Medicine

## 2019-05-06 VITALS — BP 111/69 | HR 59 | Temp 98.4°F | Ht 66.0 in | Wt 207.6 lb

## 2019-05-06 DIAGNOSIS — F329 Major depressive disorder, single episode, unspecified: Secondary | ICD-10-CM | POA: Insufficient documentation

## 2019-05-06 DIAGNOSIS — F32A Depression, unspecified: Secondary | ICD-10-CM | POA: Insufficient documentation

## 2019-05-06 DIAGNOSIS — Z6830 Body mass index (BMI) 30.0-30.9, adult: Secondary | ICD-10-CM | POA: Diagnosis not present

## 2019-05-06 DIAGNOSIS — F419 Anxiety disorder, unspecified: Secondary | ICD-10-CM

## 2019-05-06 DIAGNOSIS — E039 Hypothyroidism, unspecified: Secondary | ICD-10-CM

## 2019-05-06 NOTE — Patient Instructions (Signed)
The following recommendations are helpful adjuncts for helping rebalance your mood.  Eat a nourishing diet. Ensure adequate intake of calories, protein, carbs, fat, vitamins, and minerals. Prioritize whole foods at each meal, including meats, vegetables, fruits, nuts and seeds, etc.   Avoid inflammatory and/or "junk" foods, such as sugar, omega-6 fats, refined grains, chemicals, and preservatives are common in packaged and prepared foods. Minimize or completely avoid these ingredients and stick to whole foods with little to no additives. Cook from scratch as much as possible for more control over what you eat  Get enough sleep. Poor sleep is significantly associated with depression and anxiety. Make 7-9 hours of sleep nightly a top priority  Exercise appropriately. Exercise is known to improve brain functioning and boost mood. Aim for 30 minutes of daily physical activity. Avoid "overtraining," which can cause mental disturbances  Assess your light exposure. Not enough natural light during the day and too much artificial light can have a major impact on your mood. Get outside as often as possible during daylight hours. Minimize light exposure after dark and avoid the use of electronics that give off blue light before bed  Manage your stress.  Use daily stress management techniques such as meditation, yoga, or mindfulness to retrain your brain to respond differently to stress. Try deep breathing to deactivate your "fight or flight" response.  There are many of sources with apps like Headspace, Calm or a variety of YouTube videos (videos from [employer] have guided meditation)  Prioritize your social life. Work on building social support with new friends or improve current relationships. Consider getting a pet that allows for companionship, social interaction, and physical touch. Try volunteering or joining a faith-based community to increase your sense of purpose  4-7-8 breathing technique at  bedtime: breathe in to count of 4, hold breath for count of 7, exhale for count of 8; do 3-5 times for letting go of overactive thoughts  Take time to play Unstructured "play" time can help reduce anxiety and depression Options for play include music, games, sports, dance, art, etc.  Try to add daily omega 3 fatty acids, magnesium, B complex, and balanced amino acid supplements to help improve mood and anxiety.  We will plan to see you back in 6 months for hypothyroidism and anxiety follow up.  You will receive a survey after today's visit either digitally by e-mail or paper by [employer]. Your experiences and feedback matter to [place].  Please respond so we know how we are doing as we provide care for you.  Call us with any questions/concerns/needs.  It is my goal to be available to you for your health concerns.  Thanks for choosing me to be a partner in your healthcare needs!  Charlaine Dalton, FNP-C Family Nurse Practitioner [facility] Phone: [phone]

## 2019-05-06 NOTE — Progress Notes (Signed)
Subjective:    Patient ID: Carolyn Warner, female    DOB: 11/14/1975, 44 y.o.   MRN: [id number]  Carolyn Warner is a 44 y.o. female presenting on 05/06/2019 for Hypothyroidism   HPI  Ms. Caris presents to clinic for a follow up on her hypothyroidism.  Has no acute concerns today.  Depression screen [facility] 2/9 05/06/2019 12/11/2018 10/14/2018  Decreased Interest 0 0 0  Down, Depressed, Hopeless 0 0 0  PHQ - 2 Score 0 0 0  Altered sleeping 0 0 -  Tired, decreased energy 0 0 -  Change in appetite 0 0 -  Feeling bad or failure about yourself  0 0 -  Trouble concentrating 0 0 -  Moving slowly or fidgety/restless 0 0 -  Suicidal thoughts 0 0 -  PHQ-9 Score 0 0 -  Difficult doing work/chores Not difficult at all Not difficult at all -    Social History   Tobacco Use  . Smoking status: Never Smoker  . Smokeless tobacco: Never Used  Substance Use Topics  . Alcohol use: No  . Drug use: No    Review of Systems  Constitutional: Negative.   HENT: Negative.   Eyes: Negative.   Respiratory: Negative.   Cardiovascular: Negative.   Gastrointestinal: Negative.   Endocrine: Negative.   Genitourinary: Negative.   Musculoskeletal: Negative.   Skin: Negative.   Allergic/Immunologic: Negative.   Neurological: Negative.   Hematological: Negative.   Psychiatric/Behavioral: Negative.    Per HPI unless specifically indicated above     Objective:    BP 111/69 (BP Location: Left Arm, Patient Position: Sitting, Cuff Size: Normal)   Pulse (!) 59   Temp 98.4 F (36.9 C) (Oral)   Ht 5\' 6"  (1.676 m)   Wt 207 lb 9.6 oz (94.2 kg)   BMI 33.51 kg/m   Wt Readings from Last 3 Encounters:  05/06/19 207 lb 9.6 oz (94.2 kg)  12/30/18 233 lb 8 oz (105.9 kg)  12/11/18 244 lb 12.8 oz (111 kg)    Physical Exam Vitals reviewed.  Constitutional:      General: She is not in acute distress.    Appearance: Normal appearance. She is well-groomed. She is obese. She is not ill-appearing or  toxic-appearing.  HENT:     Head: Normocephalic.  Eyes:     General: Lids are normal. Vision grossly intact. No scleral icterus.       Right eye: No discharge.        Left eye: No discharge.     Extraocular Movements: Extraocular movements intact.     Conjunctiva/sclera: Conjunctivae normal.     Pupils: Pupils are equal, round, and reactive to light.  Cardiovascular:     Rate and Rhythm: Normal rate and regular rhythm.     Pulses: Normal pulses.     Heart sounds: Normal heart sounds. No murmur. No friction rub. No gallop.   Pulmonary:     Effort: Pulmonary effort is normal. No respiratory distress.     Breath sounds: Normal breath sounds.  Abdominal:     General: Bowel sounds are normal.     Palpations: Abdomen is soft.     Tenderness: There is no abdominal tenderness.  Musculoskeletal:        General: Normal range of motion.     Cervical back: Normal range of motion.     Right lower leg: No edema.     Left lower leg: No edema.  Skin:    General:  Skin is warm and dry.     Capillary Refill: Capillary refill takes less than 2 seconds.  Neurological:     General: No focal deficit present.     Mental Status: She is alert and oriented to person, place, and time.     Cranial Nerves: No cranial nerve deficit.     Sensory: No sensory deficit.     Motor: No weakness.     Coordination: Coordination normal.     Gait: Gait normal.  Psychiatric:        Attention and Perception: Attention and perception normal.        Mood and Affect: Mood and affect normal.        Speech: Speech normal.        Behavior: Behavior normal. Behavior is cooperative.        Thought Content: Thought content normal.        Cognition and Memory: Cognition and memory normal.        Judgment: Judgment normal.     Results for orders placed or performed in visit on 12/11/18  TSH  Result Value Ref Range   TSH 0.04 (L) mIU/L  T4, free  Result Value Ref Range   Free T4 1.6 0.8 - 1.8 ng/dL      Assessment  & Plan:   Problem List Items Addressed This Visit      Endocrine   Acquired hypothyroidism - Primary    Labs drawn last 12/2018 and doing well.  Had gastric sleeve surgery in September and had titrated down her dose of Levothyroxine from 239mcg to 151mcg.  Discussed may be decreasing her dose again once we receive lab results.  In agreement with plan without any additional questions/concerns/needs.  Labs ordered today and to be drawn on Tuesday.  Will contact once receive results.  Will see back in clinic in 6 months.      Relevant Orders   CBC with Differential   COMPLETE METABOLIC PANEL WITH GFR   Thyroid Panel With TSH     Other   Anxiety    Stable and well controlled.  Has recently stopped taking her Cymbalta and has no concerns for anxiety at this time.  Discussed if has any concerns that arise to contact our office and can plan to restart a medication if needed.       Other Visit Diagnoses    BMI 30.0-30.9,adult       Relevant Orders   Lipid Profile      No orders of the defined types were placed in this encounter.     Follow up plan: Return in about 6 months (around 11/06/2019) for Hypothyroid/anxiety f/u.   Harlin Rain, [place] Family Nurse Practitioner [facility] 05/06/2019, 3:52 PM

## 2019-05-06 NOTE — Assessment & Plan Note (Signed)
Stable and well controlled.  Has recently stopped taking her Cymbalta and has no concerns for anxiety at this time.  Discussed if has any concerns that arise to contact our office and can plan to restart a medication if needed.

## 2019-05-06 NOTE — Assessment & Plan Note (Signed)
Labs drawn last 12/2018 and doing well.  Had gastric sleeve surgery in September and had titrated down her dose of Levothyroxine from to .  Discussed may be decreasing her dose again once we receive lab results.  In agreement with plan without any additional questions/concerns/needs.  Labs ordered today and to be drawn on Tuesday.  Will contact once receive results.  Will see back in clinic in 6 months.

## 2019-05-11 ENCOUNTER — Other Ambulatory Visit: Payer: Self-pay

## 2019-05-11 ENCOUNTER — Other Ambulatory Visit: Payer: BC Managed Care – PPO

## 2019-05-12 ENCOUNTER — Other Ambulatory Visit: Payer: Self-pay | Admitting: Family Medicine

## 2019-05-12 DIAGNOSIS — E039 Hypothyroidism, unspecified: Secondary | ICD-10-CM

## 2019-05-12 LAB — CBC WITH DIFFERENTIAL/PLATELET
Absolute Monocytes: 445 cells/uL (ref 200–950)
Basophils Absolute: 29 cells/uL (ref 0–200)
Basophils Relative: 0.5 %
Eosinophils Absolute: 154 cells/uL (ref 15–500)
Eosinophils Relative: 2.7 %
HCT: 42.8 % (ref 35.0–45.0)
Hemoglobin: 14.2 g/dL (ref 11.7–15.5)
Lymphs Abs: 1522 cells/uL (ref 850–3900)
MCH: 31.8 pg (ref 27.0–33.0)
MCHC: 33.2 g/dL (ref 32.0–36.0)
MCV: 95.7 fL (ref 80.0–100.0)
MPV: 10.9 fL (ref 7.5–12.5)
Monocytes Relative: 7.8 %
Neutro Abs: 3551 cells/uL (ref 1500–7800)
Neutrophils Relative %: 62.3 %
Platelets: 219 10*3/uL (ref 140–400)
RBC: 4.47 10*6/uL (ref 3.80–5.10)
RDW: 12.8 % (ref 11.0–15.0)
Total Lymphocyte: 26.7 %
WBC: 5.7 10*3/uL (ref 3.8–10.8)

## 2019-05-12 LAB — COMPLETE METABOLIC PANEL WITHOUT GFR
AG Ratio: 1.8 (calc) (ref 1.0–2.5)
ALT: 15 U/L (ref 6–29)
AST: 15 U/L (ref 10–30)
Albumin: 4.1 g/dL (ref 3.6–5.1)
Alkaline phosphatase (APISO): 47 U/L (ref 31–125)
BUN: 17 mg/dL (ref 7–25)
CO2: 26 mmol/L (ref 20–32)
Calcium: 9.1 mg/dL (ref 8.6–10.2)
Chloride: 107 mmol/L (ref 98–110)
Creat: 0.72 mg/dL (ref 0.50–1.10)
GFR, Est African American: 120 mL/min/1.73m2
GFR, Est Non African American: 103 mL/min/1.73m2
Globulin: 2.3 g/dL (ref 1.9–3.7)
Glucose, Bld: 86 mg/dL (ref 65–99)
Potassium: 4.1 mmol/L (ref 3.5–5.3)
Sodium: 141 mmol/L (ref 135–146)
Total Bilirubin: 0.5 mg/dL (ref 0.2–1.2)
Total Protein: 6.4 g/dL (ref 6.1–8.1)

## 2019-05-12 LAB — LIPID PANEL
Cholesterol: 134 mg/dL (ref <200)
HDL: 37 mg/dL — ABNORMAL LOW (ref >=50)
LDL Cholesterol (Calc): 78 mg/dL (calc)
Non-HDL Cholesterol (Calc): 97 mg/dL (calc) (ref <130)
Total CHOL/HDL Ratio: 3.6 (calc) (ref <5.0)
Triglycerides: 103 mg/dL (ref <150)

## 2019-05-12 LAB — THYROID PANEL WITH TSH
Free Thyroxine Index: 3.6 (ref 1.4–3.8)
T3 Uptake: 36 % — ABNORMAL HIGH (ref 22–35)
T4, Total: 9.9 ug/dL (ref 5.1–11.9)
TSH: 0.11 m[IU]/L — ABNORMAL LOW

## 2019-05-12 MED ORDER — LEVOTHYROXINE SODIUM 150 MCG PO TABS: 150.0000 ug | ORAL_TABLET | Freq: Every day | ORAL | 0 refills | Status: DC

## 2019-05-12 NOTE — Progress Notes (Signed)
Labs look great.  Her thyroid function is low, she is overmedicated with her Levothyroxine.  Related to gastric surgery and weight loss.  I have sent in a prescription for Levothyroxine and to begin that (was taking Levothyroxine ) and will have her repeat her thyroid function labs in 6-8 weeks.  I have put the order in for those labs.  Thanks

## 2019-05-12 NOTE — Progress Notes (Signed)
Labs for thyroid function showing over medicated, likely related to s/p gastric surgery and weight loss.  Decreased Levothyroxine from daily to daily.  Will recheck thyroid function in 6-8 weeks and re-evaluate.

## 2019-05-20 ENCOUNTER — Ambulatory Visit: Payer: BC Managed Care – PPO | Admitting: Dietician

## 2019-06-02 ENCOUNTER — Other Ambulatory Visit: Payer: Self-pay

## 2019-06-02 ENCOUNTER — Encounter: Payer: BC Managed Care – PPO | Attending: Surgery | Admitting: Dietician

## 2019-06-02 VITALS — Ht 66.0 in | Wt 204.7 lb

## 2019-06-02 DIAGNOSIS — E6609 Other obesity due to excess calories: Secondary | ICD-10-CM | POA: Diagnosis not present

## 2019-06-02 DIAGNOSIS — Z6833 Body mass index (BMI) 33.0-33.9, adult: Secondary | ICD-10-CM | POA: Diagnosis not present

## 2019-06-02 NOTE — Progress Notes (Signed)
Follow-up visit:  6 Months Post-Operative Sleeve Gastrectomy Surgery  Medical Nutrition Therapy:  Appt start time: 1330 end time:  1400.  Primary concerns today: Post-operative Bariatric Surgery Nutrition Management.   Learning Readiness:   Change in progress  Weight: 204.7lbs Height: 5'6" Weight at previous NDES visit: 233.5lbs    Progress:   Weight loss of 28.2lbs since previous visit on 12/29/18  Patient is unable to eat more than 1-2oz of meat/ solid protein at a time, so she has been eating every 1.5 - 2 hours to meet nutritional goals.   She reports ongoing issue with constipation; also some foods ie larger portions of sugar tend to lead to intestinal upset.   She is using baritastic app to periodically track intake, and is currently averaging about 800kcal daily.   She feels comfortable with her current eating pattern and food choices, occasionally tries new bariatric friendly recipe; her family is eating the bariatric style meals she prepares.   Dietary recall: Breakfast: 1/2c. oats + 2/3c unsw. almond milk, berries (for constipation); 1 egg + 2 sl bacon; 1 egg + polish sausage Snack: cheese or protein shake (1 per day) or protein bar Lunch: 1/2 - 1c. tuna or chicken salad made with [place] yogurt, occ with 6 crackers Snack: same as am  Dinner: 1- 1 1/2oz chicken, ground beef or pork + asparagus/ broccoli/ green beans/ onion and peppers Snack: same as am  Fluid intake: water, protein shake 64+ oz daily per patient Estimated total protein intake: 60-75g daily  Medications: levonorgestrel IUD, levothyroxine Supplementation: bariatric multivitamin 2x daily  Using straws: no Drinking while eating: no Hair loss: yes, now improving ater 2-3 months of hair loss Carbonated beverages: no N/V/D/C: constipation -- OTC aids have not helped; patient has added more vegetables, some fruit, and some oats into her diet for fiber, which has helped somewhat Dumping syndrome:  no   Recent physical activity:  Walking 2.5-3 miles daily or stationary bike during inclement weather  Progress Towards Goal(s):  In progress.  Handouts given during visit include:  Phase 5 and 6 bariatric diet handouts  800-1000kcal bariatric menus (AND)  Goals and instructions (AVS)   Nutritional Diagnosis:  [place]-3.3 Overweight/obesity As related to history of excess calories and inactivity due to back pain.  As evidenced by patient with current BMI of 33.1, following bariatric diet guidelines for ongoing weight loss after bariatric surgery.    Intervention:    Reviewed progress since previous visit  Discussed possible triggers for constipation and options for managing.  Discussed ways to add fiber without increasing GI distress.   Updated nutrition goals with input from patient.   Teaching Method Utilized:  Visual Auditory Hands on  Barriers to learning/adherence to lifestyle change: none  Demonstrated degree of understanding via:  Teach Back   Monitoring/Evaluation:  Dietary intake, exercise, and body weight. Follow up in 3 months for 9 month post-op visit -- patient will schedule later.

## 2019-06-02 NOTE — Patient Instructions (Signed)
   Continue to incorporate foods that have fiber to help with constipation -- veggies, fruits, whole grain foods in small portions.   When adding starches/ carbs to a meal, keep to 15grams, not more than 30grams per meal.   Continue to drink plenty of water and other sugar free fluids daily, and continue with regular exercise.   Include several serving of dairy/ high calcium foods especially if not taking calcium supplement. Consider supplementing 1-2 x daily if more tends to worsen constipation.

## 2019-07-04 ENCOUNTER — Other Ambulatory Visit: Payer: Self-pay | Admitting: Family Medicine

## 2019-07-04 DIAGNOSIS — E039 Hypothyroidism, unspecified: Secondary | ICD-10-CM

## 2019-07-04 NOTE — Telephone Encounter (Signed)
Requested Prescriptions  Pending Prescriptions Disp Refills  . SYNTHROID 150 MCG tablet [Pharmacy Med Name: SYNTHROID 150 MCG TABLET] 60 tablet 0    Sig: TAKE 1 TABLET BY MOUTH EVERY DAY     Endocrinology:  Hypothyroid Agents Failed - 07/04/2019  9:48 AM      Failed - TSH needs to be rechecked within 3 months after an abnormal result. Refill until TSH is due.      Failed - TSH in normal range and within 360 days    TSH  Date Value Ref Range Status  05/11/2019 0.11 (L) mIU/L Final    Comment:              Reference Range .           > or = 20 Years  0.40-4.50 .                Pregnancy Ranges           First trimester    0.26-2.66           Second trimester   0.55-2.73           Third trimester    0.43-2.91          Passed - Valid encounter within last 12 months    Recent Outpatient Visits          1 month ago Acquired hypothyroidism   [facility], Jodelle Gross, FNP   6 months ago Acquired hypothyroidism   [facility] Galen Manila, NP   1 year ago Acute midline low back pain without sciatica   [facility] Galen Manila, NP   1 year ago Fatigue, unspecified type   [facility] Galen Manila, NP   1 year ago Acquired hypothyroidism   [facility] Galen Manila, NP

## 2019-07-15 ENCOUNTER — Other Ambulatory Visit: Payer: BC Managed Care – PPO

## 2019-07-15 ENCOUNTER — Other Ambulatory Visit: Payer: Self-pay

## 2019-07-15 DIAGNOSIS — E039 Hypothyroidism, unspecified: Secondary | ICD-10-CM

## 2019-07-16 LAB — THYROID PANEL WITH TSH
Free Thyroxine Index: 1.8 (ref 1.4–3.8)
T3 Uptake: 32 % (ref 22–35)
T4, Total: 5.5 ug/dL (ref 5.1–11.9)
TSH: 0.87 mIU/L

## 2019-09-05 ENCOUNTER — Other Ambulatory Visit: Payer: Self-pay | Admitting: Family Medicine

## 2019-09-05 DIAGNOSIS — E039 Hypothyroidism, unspecified: Secondary | ICD-10-CM

## 2019-09-05 NOTE — Telephone Encounter (Signed)
Requested Prescriptions  Pending Prescriptions Disp Refills  . SYNTHROID 150 MCG tablet [Pharmacy Med Name: SYNTHROID 150 MCG TABLET] 90 tablet 2    Sig: TAKE 1 TABLET BY MOUTH EVERY DAY     Endocrinology:  Hypothyroid Agents Failed - 09/05/2019  8:30 AM      Failed - TSH needs to be rechecked within 3 months after an abnormal result. Refill until TSH is due.      Passed - TSH in normal range and within 360 days    TSH  Date Value Ref Range Status  07/15/2019 0.87 mIU/L Final    Comment:              Reference Range .           > or = 20 Years  0.40-4.50 .                Pregnancy Ranges           First trimester    0.26-2.66           Second trimester   0.55-2.73           Third trimester    0.43-2.91          Passed - Valid encounter within last 12 months    Recent Outpatient Visits          4 months ago Acquired hypothyroidism   [facility], Jodelle Gross, FNP   8 months ago Acquired hypothyroidism   [facility] Galen Manila, NP   1 year ago Acute midline low back pain without sciatica   [facility] Galen Manila, NP   1 year ago Fatigue, unspecified type   [facility] Galen Manila, NP   1 year ago Acquired hypothyroidism   [facility] Galen Manila, NP

## 2019-11-12 ENCOUNTER — Telehealth: Payer: Self-pay | Admitting: Dietician

## 2019-11-12 NOTE — Telephone Encounter (Signed)
Called patient to check on progress, as her bariatric surgery was 12 months ago. Left voicemail message requesting a call back, and offered MNT follow up.

## 2019-11-17 LAB — RESULTS CONSOLE HPV: CHL HPV: NEGATIVE

## 2019-11-17 LAB — HM PAP SMEAR: HM Pap smear: NEGATIVE

## 2019-11-26 ENCOUNTER — Encounter: Payer: Self-pay | Admitting: Dietician

## 2019-11-26 NOTE — Progress Notes (Signed)
Have not heard back from patient to schedule her 12 month post-op MNT visit. Sent notification to referring provider.

## 2019-12-15 IMAGING — RF UPPER GI SERIES W/HIGH DENSITY WITHOUT KUB
12 of 18 series · 14 of 21 positions shown · non-contrast
Comparison: None.

CLINICAL DATA: Preop assessment for gastric sleeve procedure.

EXAM:
UPPER GI SERIES WITHOUT KUB
TECHNIQUE: Routine upper GI series was performed with thin/high density/water
soluble barium.
FLUOROSCOPY TIME:  Fluoroscopy Time:  24 seconds
Radiation Exposure Index (if provided by the fluoroscopic device):
9.6 mGy
Number of Acquired Spot Images: 4

[Series 1: cp_standard · 0.25mm/px · 3 of 15 frames shown (1 of 9)]
[frame 3/15]
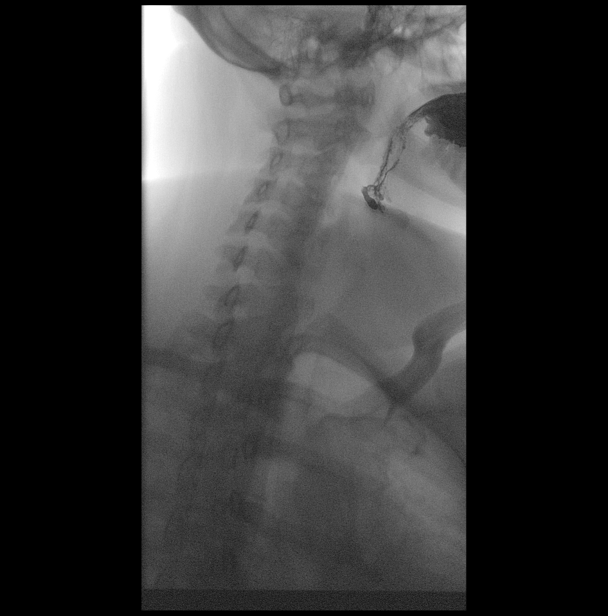
[frame 8/15]
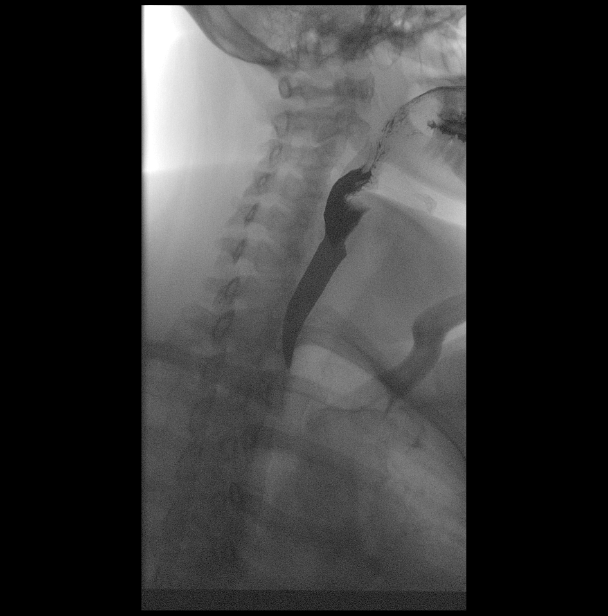
[frame 13/15]
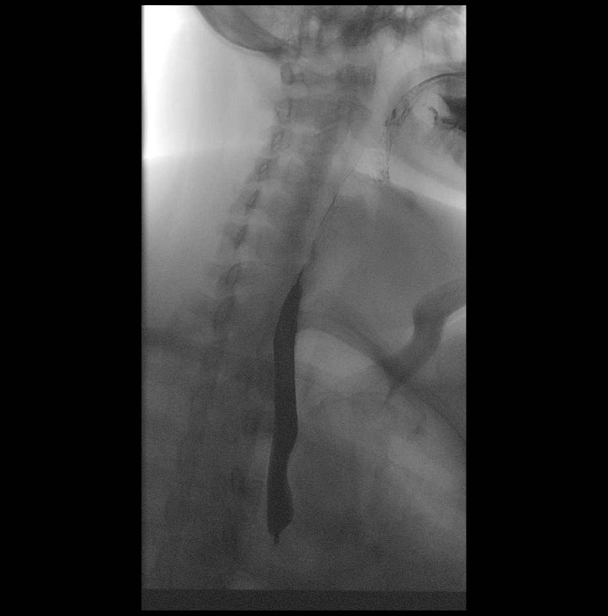

[Series 3: cp_standard · 0.25mm/px · 1 of 1 slices shown (2 of 9)]
[im 1/1]
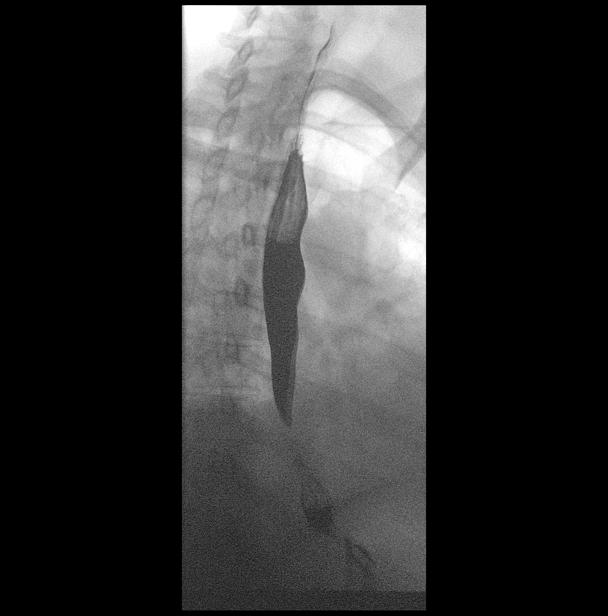

[Series 4: cp_standard · 0.25mm/px · 1 of 1 slices shown (3 of 9)]
[im 1/1]
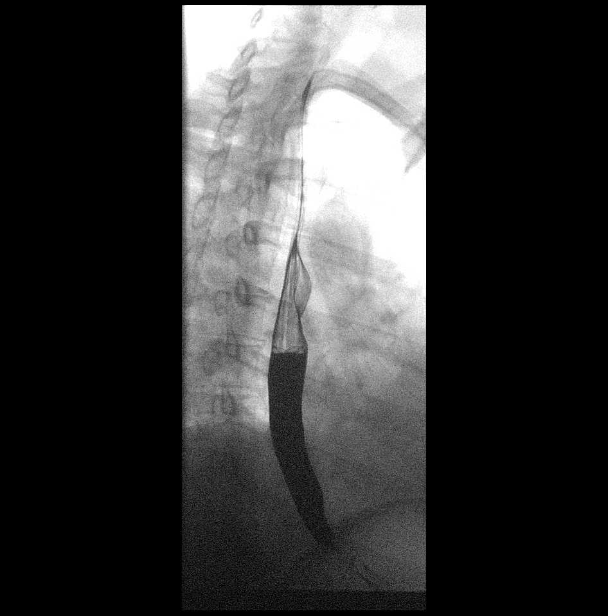

[Series 6: cp_standard · 0.25mm/px · 1 of 1 slices shown (4 of 9)]
[im 1/1]
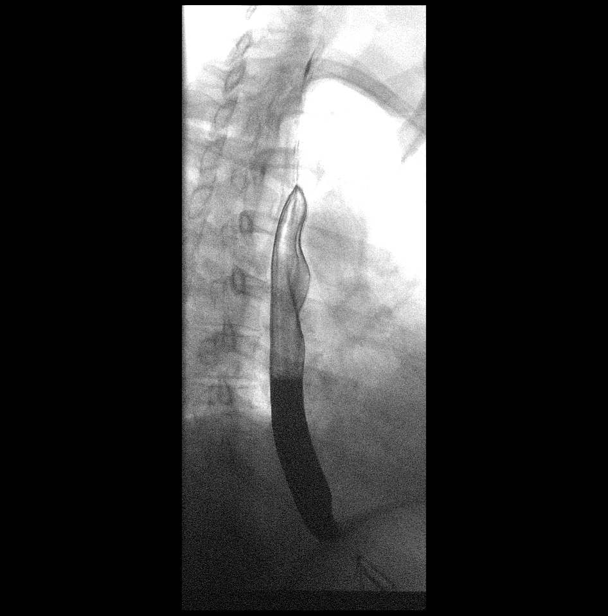

[Series 7: cp_standard · 0.25mm/px · 1 of 1 slices shown (5 of 9)]
[im 1/1]
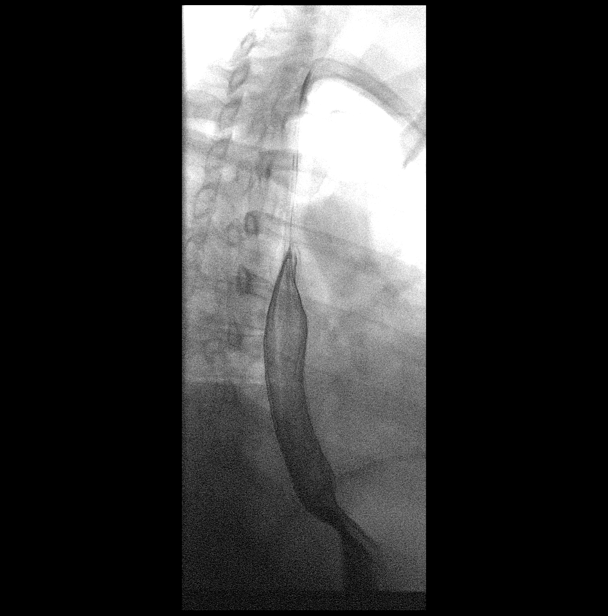

[Series 9: cp_standard · 0.27mm/px · 1 of 1 slices shown (6 of 9)]
[im 1/1]
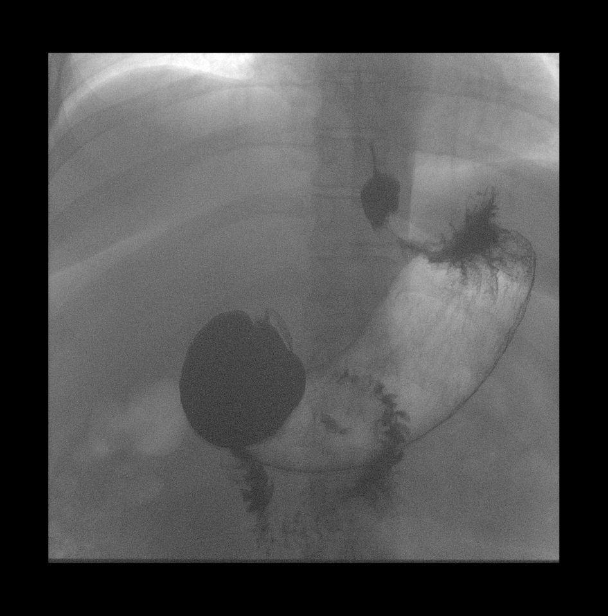

[Series 10: cp_standard · 0.27mm/px · 1 of 1 slices shown (7 of 9)]
[im 1/1]
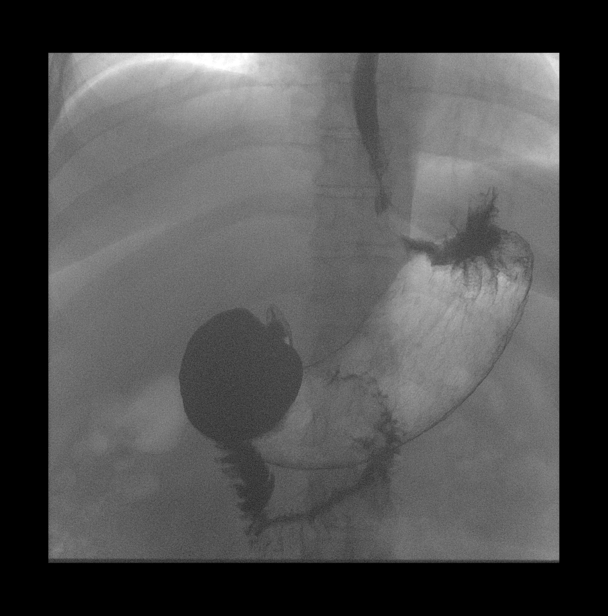

[Series 12: cp_standard · 0.28mm/px · 1 of 1 slices shown (8 of 9)]
[im 1/1]
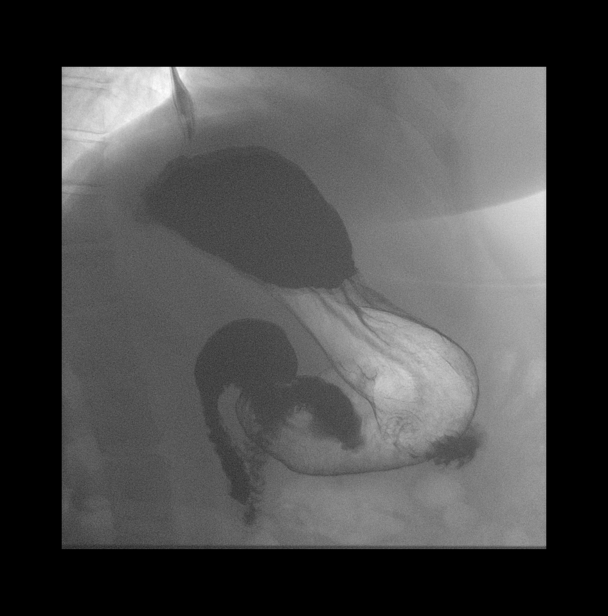

[Series 13: fluoro_barium singleshot_bw · 0.19mm/px · 1 of 1 slices shown (1 of 3)]
[im 1/1]
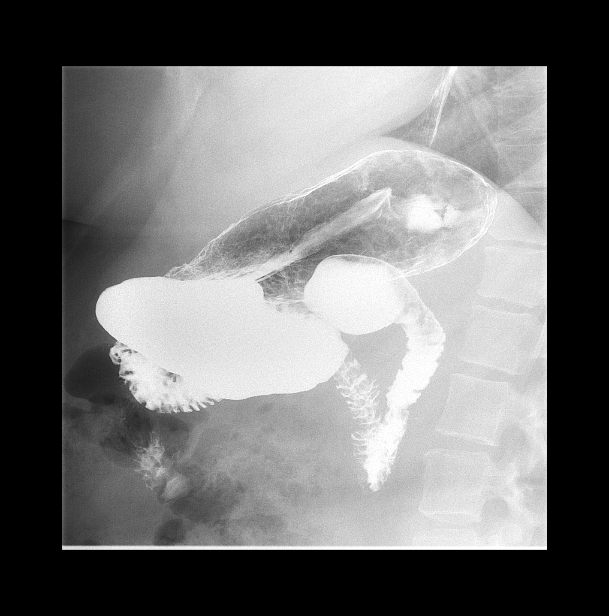

[Series 15: fluoro_barium singleshot_bw · 0.19mm/px · 1 of 1 slices shown (2 of 3)]
[im 1/1]
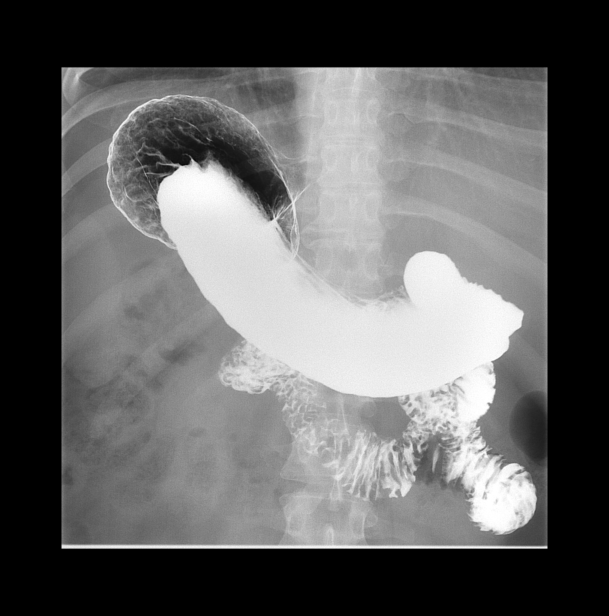

[Series 16: cp_standard · 0.28mm/px · 1 of 1 slices shown (9 of 9)]
[im 1/1]
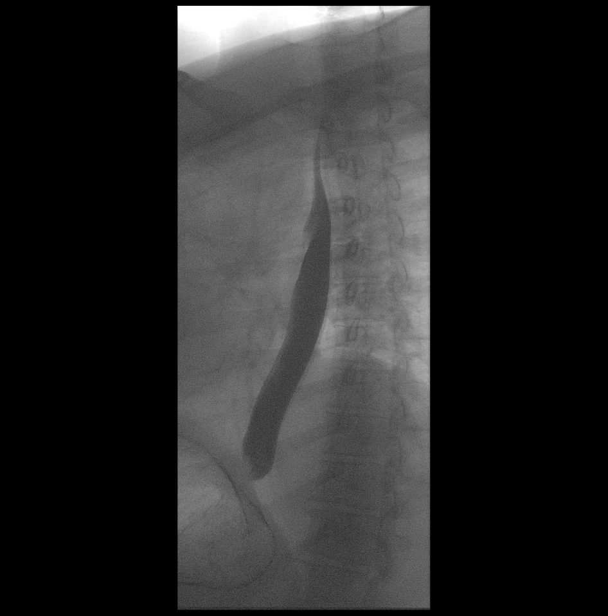

[Series 18: fluoro_barium singleshot_bw · 0.19mm/px · 1 of 1 slices shown (3 of 3)]
[im 1/1]
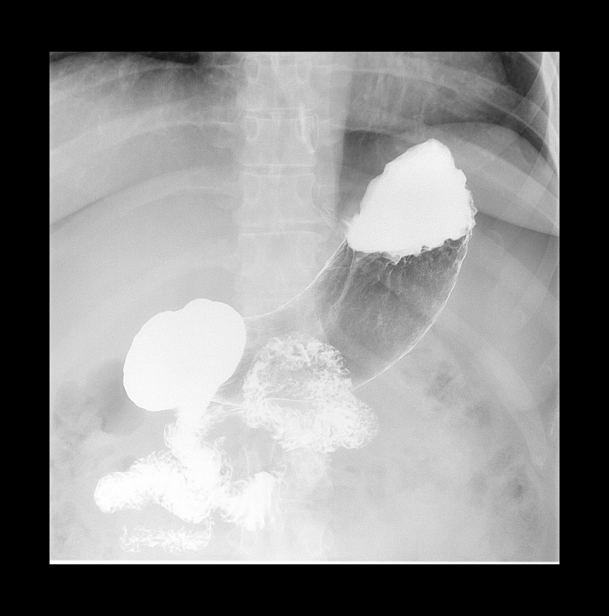

[14 of 21 positions shown; findings below may reference images not displayed]

FINDINGS: Examination of the esophagus demonstrated normal esophageal
motility. Normal esophageal morphology without evidence of
esophagitis or ulceration. No esophageal stricture, diverticula, or
mass lesion. No evidence of hiatal hernia. Mild gastroesophageal
reflux.

Examination of the stomach demonstrated normal rugal folds and areae
gastricae. The gastric mucosa appeared unremarkable without evidence
of ulceration, scarring, or mass lesion. Gastric motility and
emptying was normal. Fluoroscopic examination of the duodenum
demonstrates normal motility and morphology without evidence of
ulceration or mass lesion.
IMPRESSION: 1. Mild gastroesophageal reflux.
2. Otherwise normal upper GI.

## 2019-12-16 LAB — HM MAMMOGRAPHY

## 2020-07-10 NOTE — Telephone Encounter (Signed)
Pt called asking why her synthroid was discontinued  She is out and needs a refill ASAP  [facility] [place]  CB#  [phone]

## 2020-07-12 ENCOUNTER — Telehealth: Payer: Self-pay

## 2020-07-12 NOTE — Telephone Encounter (Signed)
Pt is requesting a refill on synthroid  150 MG

## 2020-07-13 NOTE — Telephone Encounter (Signed)
This will be refilled at her appt tomorrow

## 2020-07-14 ENCOUNTER — Ambulatory Visit: Payer: BC Managed Care – PPO | Admitting: Internal Medicine

## 2020-07-14 ENCOUNTER — Encounter: Payer: Self-pay | Admitting: Internal Medicine

## 2020-07-14 ENCOUNTER — Other Ambulatory Visit: Payer: Self-pay

## 2020-07-14 DIAGNOSIS — E039 Hypothyroidism, unspecified: Secondary | ICD-10-CM

## 2020-07-14 DIAGNOSIS — G43019 Migraine without aura, intractable, without status migrainosus: Secondary | ICD-10-CM

## 2020-07-14 DIAGNOSIS — F419 Anxiety disorder, unspecified: Secondary | ICD-10-CM | POA: Diagnosis not present

## 2020-07-14 DIAGNOSIS — G894 Chronic pain syndrome: Secondary | ICD-10-CM | POA: Diagnosis not present

## 2020-07-14 DIAGNOSIS — F32A Depression, unspecified: Secondary | ICD-10-CM

## 2020-07-14 MED ORDER — LEVOTHYROXINE SODIUM 150 MCG PO TABS: 150.0000 ug | ORAL_TABLET | Freq: Every day | ORAL | 0 refills | Status: DC

## 2020-07-14 NOTE — Assessment & Plan Note (Signed)
Will have her schedule lab visit for TSH and Free T4 Levothyroxine refilled today, will adjust if needed based on labs

## 2020-07-14 NOTE — Assessment & Plan Note (Signed)
No longer an issue Will monitor

## 2020-07-14 NOTE — Progress Notes (Signed)
Subjective:    Patient ID: Carolyn Warner, female    DOB: 1975-06-26, 45 y.o.   MRN: [id number]  HPI  Patient presents the clinic today for follow-up of chronic conditions.  She is establishing care with me today, transferring care from Malva Cogan, NP.  Migraines: These are no longer an issues. She no longer takes Rizatriptan.  Hypothyroidism: She denies any issues on her current dose of Levothyroxine.  She is due for repeat labs today.  She does not follow with endocrinology.  Chronic Pain Syndrome: Her back pain resolved with 100 lb weight s/p gastric sleeve. She does not take any medications OTC for this.  Anxiety and Depression: Currently not an issue. She no longer take Duloxetine. She is not seeing a therapist.  Review of Systems      Past Medical History:  Diagnosis Date  . Anxiety   . 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 Common migraine with intractable migraine 05/31/2016  . Depression   . Headache   . Thyroid disease     Current Outpatient Medications  Medication Sig Dispense Refill  . Calcium-Vitamin D-Vitamin K (CALCIUM SOFT CHEWS PO) Take 1 each by mouth 3 (three) times daily.    . DULoxetine (CYMBALTA) 20 MG capsule Take 1 capsule (20 mg total) by mouth daily. (Patient not taking: Reported on 05/06/2019) 30 capsule 3  . levonorgestrel (MIRENA) 20 MCG/24HR IUD by Intrauterine route.    . Multiple Vitamins-Minerals (BARIATRIC MULTIVITAMINS/IRON PO) Take by mouth.    . rizatriptan (MAXALT) 10 MG tablet Take 1 tablet at the onset of migraine. May repeat in 2 hours if needed (Patient not taking: Reported on 06/02/2019) 10 tablet 11  . SYNTHROID 150 MCG tablet TAKE 1 TABLET BY MOUTH EVERY DAY 90 tablet 2   No current facility-administered medications for this visit.    No Known Allergies  Family History  Problem Relation Age of Onset  . Thyroid disease Mother   . Healthy Father   . Alzheimer's disease Maternal Grandmother   . Thyroid  cancer Maternal Grandmother   . Stroke Paternal Grandmother   . Thyroid disease Son   . Healthy Maternal Grandfather     Social History   Socioeconomic History  . Marital status: Married    Spouse name: Not on file  . Number of children: 3  . Years of education: College  . Highest education level: Associate degree: occupational, Scientist, product/process development, or vocational program  Occupational History  . Occupation: [employer]  . Smoking status: Never Smoker  . Smokeless tobacco: Never Used  Vaping Use  . Vaping Use: Never used  Substance and Sexual Activity  . Alcohol use: No  . Drug use: No  . Sexual activity: Yes    Birth control/protection: I.U.D.  Other Topics Concern  . Not on file  Social History Narrative   Lives with 3 kids   Caffeine use: occasional (coffee)   Tea-half and half- once per week   Right-handed   Social Determinants of Health   Financial Resource Strain: Not on file  Food Insecurity: Not on file  Transportation Needs: Not on file  Physical Activity: Not on file  Stress: Not on file  Social Connections: Not on file  Intimate Partner Violence: Not on file     Constitutional: Denies fever, malaise, fatigue, headaches, or abrupt weight changes.  HEENT: Denies eye pain, eye redness, ear pain, ringing in the ears, wax buildup, runny  nose, nasal congestion, bloody nose, or sore throat. Respiratory: Denies difficulty breathing, shortness of breath, cough or sputum production.   Cardiovascular: Denies chest pain, chest tightness, palpitations or swelling in the hands or feet.  Gastrointestinal: Denies abdominal pain, bloating, constipation, diarrhea or blood in the stool.  GU: Denies urgency, frequency, pain with urination, burning sensation, blood in urine, odor or discharge. Musculoskeletal:  Denies decrease in range of motion, difficulty with gait, muscle pain or joint swelling.  Skin: Denies redness, rashes, lesions or ulcercations.   Neurological: Denies dizziness, difficulty with memory, difficulty with speech or problems with balance and coordination.  Psych: Patient has a history of anxiety and depression.  Denies SI/HI.  No other specific complaints in a complete review of systems (except as listed in HPI above).  Objective:   Physical Exam   BP 102/70 (BP Location: Left Arm, Patient Position: Sitting, Cuff Size: Normal)   Pulse 61   Resp 14   Ht 5\' 6"  (1.676 m)   Wt 178 lb 12.8 oz (81.1 kg)   BMI 28.86 kg/m   Wt Readings from Last 3 Encounters:  06/02/19 204 lb 11.2 oz (92.9 kg)  05/06/19 207 lb 9.6 oz (94.2 kg)  12/30/18 233 lb 8 oz (105.9 kg)    General: Appears her stated age, well developed, well nourished in NAD. HEENT: Head: normal shape and size; Eyes: sclera white and EOMs intact;  Neck:  Neck supple, trachea midline. No masses, lumps or thyromegaly present.  Cardiovascular: Normal rate and rhythm. S1,S2 noted.  No murmur, rubs or gallops noted. Pulmonary/Chest: Normal effort and positive vesicular breath sounds. No respiratory distress. No wheezes, rales or ronchi noted.  Musculoskeletal:  No difficulty with gait.  Neurological: Alert and oriented.  Psychiatric: Mood and affect normal. Behavior is normal. Judgment and thought content normal.    BMET    Component Value Date/Time   NA 141 05/11/2019 0855   K 4.1 05/11/2019 0855   CL 107 05/11/2019 0855   CO2 26 05/11/2019 0855   GLUCOSE 86 05/11/2019 0855   BUN 17 05/11/2019 0855   CREATININE 0.72 05/11/2019 0855   CALCIUM 9.1 05/11/2019 0855   GFRNONAA 103 05/11/2019 0855   GFRAA 120 05/11/2019 0855    Lipid Panel     Component Value Date/Time   CHOL 134 05/11/2019 0855   TRIG 103 05/11/2019 0855   HDL 37 (L) 05/11/2019 0855   CHOLHDL 3.6 05/11/2019 0855   LDLCALC 78 05/11/2019 0855    CBC    Component Value Date/Time   WBC 5.7 05/11/2019 0855   RBC 4.47 05/11/2019 0855   HGB 14.2 05/11/2019 0855   HCT 42.8 05/11/2019  0855   PLT 219 05/11/2019 0855   MCV 95.7 05/11/2019 0855   MCH 31.8 05/11/2019 0855   MCHC 33.2 05/11/2019 0855   RDW 12.8 05/11/2019 0855   LYMPHSABS 1,522 05/11/2019 0855   MONOABS 0.7 11/05/2018 0327   EOSABS 154 05/11/2019 0855   BASOSABS 29 05/11/2019 0855    Hgb A1C Lab Results  Component Value Date   HGBA1C 5.1 04/29/2018           Assessment & Plan:    05/01/2018, NP This visit occurred during the SARS-CoV-2 public health emergency.  Safety protocols were in place, including screening questions prior to the visit, additional usage of staff PPE, and extensive cleaning of exam room while observing appropriate contact time as indicated for disinfecting solutions.

## 2020-07-14 NOTE — Assessment & Plan Note (Signed)
Resolved with 100 lb weight loss Will monitor

## 2020-07-14 NOTE — Assessment & Plan Note (Signed)
Currently not an issue Will monitor 

## 2020-07-14 NOTE — Patient Instructions (Signed)
Hypothyroidism  Hypothyroidism is when the thyroid gland does not make enough of certain hormones (it is underactive). The thyroid gland is a small gland located in the lower front part of the neck, just in front of the windpipe (trachea). This gland makes hormones that help control how the body uses food for energy (metabolism) as well as how the heart and brain function. These hormones also play a role in keeping your bones strong. When the thyroid is underactive, it produces too little of the hormones thyroxine (T4) and triiodothyronine (T3). What are the causes? This condition may be caused by:  Hashimoto's disease. This is a disease in which the body's disease-fighting system (immune system) attacks the thyroid gland. This is the most common cause.  Viral infections.  Pregnancy.  Certain medicines.  Birth defects.  Past radiation treatments to the head or neck for cancer.  Past treatment with radioactive iodine.  Past exposure to radiation in the environment.  Past surgical removal of part or all of the thyroid.  Problems with a gland in the center of the brain (pituitary gland).  Lack of enough iodine in the diet. What increases the risk? You are more likely to develop this condition if:  You are female.  You have a family history of thyroid conditions.  You use a medicine called lithium.  You take medicines that affect the immune system (immunosuppressants). What are the signs or symptoms? Symptoms of this condition include:  Feeling as though you have no energy (lethargy).  Not being able to tolerate cold.  Weight gain that is not explained by a change in diet or exercise habits.  Lack of appetite.  Dry skin.  Coarse hair.  Menstrual irregularity.  Slowing of thought processes.  Constipation.  Sadness or depression. How is this diagnosed? This condition may be diagnosed based on:  Your symptoms, your medical history, and a physical exam.  Blood  tests. You may also have imaging tests, such as an ultrasound or MRI. How is this treated? This condition is treated with medicine that replaces the thyroid hormones that your body does not make. After you begin treatment, it may take several weeks for symptoms to go away. Follow these instructions at home:  Take over-the-counter and prescription medicines only as told by your health care provider.  If you start taking any new medicines, tell your health care provider.  Keep all follow-up visits as told by your health care provider. This is important. ? As your condition improves, your dosage of thyroid hormone medicine may change. ? You will need to have blood tests regularly so that your health care provider can monitor your condition. Contact a health care provider if:  Your symptoms do not get better with treatment.  You are taking thyroid hormone replacement medicine and you: ? Sweat a lot. ? Have tremors. ? Feel anxious. ? Lose weight rapidly. ? Cannot tolerate heat. ? Have emotional swings. ? Have diarrhea. ? Feel weak. Get help right away if you have:  Chest pain.  An irregular heartbeat.  A rapid heartbeat.  Difficulty breathing. Summary  Hypothyroidism is when the thyroid gland does not make enough of certain hormones (it is underactive).  When the thyroid is underactive, it produces too little of the hormones thyroxine (T4) and triiodothyronine (T3).  The most common cause is Hashimoto's disease, a disease in which the body's disease-fighting system (immune system) attacks the thyroid gland. The condition can also be caused by viral infections, medicine, pregnancy, or   past radiation treatment to the head or neck.  Symptoms may include weight gain, dry skin, constipation, feeling as though you do not have energy, and not being able to tolerate cold.  This condition is treated with medicine to replace the thyroid hormones that your body does not make. This  information is not intended to replace advice given to you by your health care provider. Make sure you discuss any questions you have with your health care provider. Document Revised: 11/19/2019 Document Reviewed: 11/04/2019 Elsevier Patient Education  2021 Elsevier Inc.  

## 2020-07-18 ENCOUNTER — Other Ambulatory Visit: Payer: BC Managed Care – PPO

## 2020-08-03 LAB — TSH: TSH: 0.95 mIU/L

## 2020-08-03 LAB — T4, FREE: Free T4: 1.2 ng/dL (ref 0.8–1.8)

## 2020-08-06 ENCOUNTER — Other Ambulatory Visit: Payer: Self-pay | Admitting: Internal Medicine

## 2020-08-06 DIAGNOSIS — E039 Hypothyroidism, unspecified: Secondary | ICD-10-CM

## 2020-08-06 NOTE — Telephone Encounter (Signed)
Requested Prescriptions  Pending Prescriptions Disp Refills  . SYNTHROID 150 MCG tablet [Pharmacy Med Name: SYNTHROID 150 MCG TABLET] 90 tablet 4    Sig: TAKE 1 TABLET BY MOUTH EVERY DAY     Endocrinology:  Hypothyroid Agents Failed - 08/06/2020  9:52 AM      Failed - TSH needs to be rechecked within 3 months after an abnormal result. Refill until TSH is due.      Passed - TSH in normal range and within 360 days    TSH  Date Value Ref Range Status  08/02/2020 0.95 mIU/L Final    Comment:              Reference Range .           > or = 20 Years  0.40-4.50 .                Pregnancy Ranges           First trimester    0.26-2.66           Second trimester   0.55-2.73           Third trimester    0.43-2.91          Passed - Valid encounter within last 12 months    Recent Outpatient Visits          3 weeks ago Acquired hypothyroidism   [facility], Salvadore Oxford, NP   1 year ago Acquired hypothyroidism   [facility], Jodelle Gross, FNP   1 year ago Acquired hypothyroidism   [facility] Galen Manila, NP   2 years ago Acute midline low back pain without sciatica   [facility] Galen Manila, NP   2 years ago Fatigue, unspecified type   [facility], Alison Stalling, NP

## 2021-08-14 ENCOUNTER — Other Ambulatory Visit: Payer: Self-pay | Admitting: Internal Medicine

## 2021-08-14 DIAGNOSIS — E039 Hypothyroidism, unspecified: Secondary | ICD-10-CM

## 2021-08-14 NOTE — Telephone Encounter (Signed)
Requested medication (s) are due for refill today: expired medication  Requested medication (s) are on the active medication list: yes  Last refill:  08/06/20 #90 4 refills  Future visit scheduled: yes in 6 days  Notes to clinic:  expired medication. Do you want to give enough medication until next OV?     Requested Prescriptions  Pending Prescriptions Disp Refills   SYNTHROID 150 MCG tablet [Pharmacy Med Name: SYNTHROID 150 MCG TABLET] 90 tablet 4    Sig: TAKE 1 TABLET BY MOUTH EVERY DAY     Endocrinology:  Hypothyroid Agents Failed - 08/14/2021  1:57 AM      Failed - TSH in normal range and within 360 days    TSH  Date Value Ref Range Status  08/02/2020 0.95 mIU/L Final    Comment:              Reference Range .           > or = 20 Years  0.40-4.50 .                Pregnancy Ranges           First trimester    0.26-2.66           Second trimester   0.55-2.73           Third trimester    0.43-2.91          Failed - Valid encounter within last 12 months    Recent Outpatient Visits           1 year ago Acquired hypothyroidism   [facility] [place], Salvadore Oxford, NP   2 years ago Acquired hypothyroidism   [facility], Jodelle Gross, FNP   2 years ago Acquired hypothyroidism   [facility] Galen Manila, NP   3 years ago Acute midline low back pain without sciatica   [facility] Galen Manila, NP   3 years ago Fatigue, unspecified type   [facility], Alison Stalling, NP       Future Appointments             In 6 days Baity, Salvadore Oxford, NP [facility], [facility]

## 2021-08-14 NOTE — Telephone Encounter (Signed)
Left message to call and make an appointment.

## 2021-08-20 ENCOUNTER — Telehealth: Payer: Self-pay

## 2021-08-20 ENCOUNTER — Encounter: Payer: Self-pay | Admitting: Internal Medicine

## 2021-08-20 ENCOUNTER — Ambulatory Visit (INDEPENDENT_AMBULATORY_CARE_PROVIDER_SITE_OTHER): Payer: BC Managed Care – PPO | Admitting: Internal Medicine

## 2021-08-20 VITALS — BP 124/86 | HR 57 | Temp 97.1°F | Ht 66.0 in | Wt 184.0 lb

## 2021-08-20 DIAGNOSIS — Z1159 Encounter for screening for other viral diseases: Secondary | ICD-10-CM

## 2021-08-20 DIAGNOSIS — E66811 Obesity, class 1: Secondary | ICD-10-CM | POA: Insufficient documentation

## 2021-08-20 DIAGNOSIS — E039 Hypothyroidism, unspecified: Secondary | ICD-10-CM | POA: Diagnosis not present

## 2021-08-20 DIAGNOSIS — E65 Localized adiposity: Secondary | ICD-10-CM

## 2021-08-20 DIAGNOSIS — E6609 Other obesity due to excess calories: Secondary | ICD-10-CM | POA: Insufficient documentation

## 2021-08-20 DIAGNOSIS — Z1211 Encounter for screening for malignant neoplasm of colon: Secondary | ICD-10-CM

## 2021-08-20 DIAGNOSIS — E663 Overweight: Secondary | ICD-10-CM

## 2021-08-20 DIAGNOSIS — Z0001 Encounter for general adult medical examination with abnormal findings: Secondary | ICD-10-CM | POA: Diagnosis not present

## 2021-08-20 DIAGNOSIS — Z114 Encounter for screening for human immunodeficiency virus [HIV]: Secondary | ICD-10-CM

## 2021-08-20 DIAGNOSIS — Z6829 Body mass index (BMI) 29.0-29.9, adult: Secondary | ICD-10-CM

## 2021-08-20 NOTE — Patient Instructions (Signed)

## 2021-08-20 NOTE — Telephone Encounter (Signed)
CALLED PATIENT NO ANSWER LEFT VOICEMAIL FOR A CALL BACK ? ?

## 2021-08-20 NOTE — Assessment & Plan Note (Signed)
Encouraged diet and exercise for weight loss ?

## 2021-08-20 NOTE — Progress Notes (Signed)
Subjective:    Patient ID: Carolyn Warner, female    DOB: 11/02/1975, 46 y.o.   MRN: [id number]  HPI  Pt presents to the clinic today for her annual exam.  She would also like a referral to plastics for panniculectomy.  She had a gastric sleeve which has resulted in 100 pound weight loss.  She reports discomfort from her pannus.  Flu: 12/2020 Tetanus: unsure Covid: Pfizer x 2 Pap smear: 2022, [facility] in [place] Mammogram: 2021, [facility] in [place] Colon screening: never Vision screening: annually Dentist: biannually  Diet: She does eat meat. She consumes more veggies than fruits. She tries to avoid fried foods. She drinks mostly water. Exercise: Cardio, 4 x week  Review of Systems     Past Medical History:  Diagnosis Date   Anxiety    Bulging of intervertebral disc between L4 and L5    Common migraine with intractable migraine 05/31/2016   Depression    Headache    Thyroid disease     Current Outpatient Medications  Medication Sig Dispense Refill   Calcium-Vitamin D-Vitamin K (CALCIUM SOFT CHEWS PO) Take 1 each by mouth 3 (three) times daily.     levonorgestrel (MIRENA) 20 MCG/24HR IUD by Intrauterine route.     Multiple Vitamins-Minerals (BARIATRIC MULTIVITAMINS/IRON PO) Take by mouth.     SYNTHROID 150 MCG tablet TAKE 1 TABLET BY MOUTH EVERY DAY 90 tablet 0   No current facility-administered medications for this visit.    No Known Allergies  Family History  Problem Relation Age of Onset   Thyroid disease Mother    Healthy Father    Alzheimer's disease Maternal Grandmother    Thyroid cancer Maternal Grandmother    Stroke Paternal Grandmother    Thyroid disease Son    Healthy Maternal Grandfather     Social History   Socioeconomic History   Marital status: Married    Spouse name: Not on file   Number of children: 3   Years of education: College   Highest education level: Associate degree: occupational, Scientist, product/process development, or vocational  program  Occupational History   Occupation: Administrator, [employer] care  Tobacco Use   Smoking status: Never   Smokeless tobacco: Never  Vaping Use   Vaping Use: Never used  Substance and Sexual Activity   Alcohol use: No   Drug use: No   Sexual activity: Yes    Birth control/protection: I.U.D.  Other Topics Concern   Not on file  Social History Narrative   Lives with 3 kids   Caffeine use: occasional (coffee)   Tea-half and half- once per week   Right-handed   Social Determinants of Health   Financial Resource Strain: Low Risk  (03/13/2018)   Overall Financial Resource Strain (CARDIA)    Difficulty of Paying Living Expenses: Not hard at all  Food Insecurity: No Food Insecurity (03/13/2018)   Hunger Vital Sign    Worried About Running Out of Food in the Last Year: Never true    Ran Out of Food in the Last Year: Never true  Transportation Needs: No Transportation Needs (03/13/2018)   PRAPARE - Administrator, Civil Service (Medical): No    Lack of Transportation (Non-Medical): No  Physical Activity: Insufficiently Active (03/13/2018)   Exercise Vital Sign    Days of Exercise per Week: 2 days    Minutes of Exercise per Session: 10 min  Stress: Not on file  Social Connections: Not on file  Intimate Partner  Violence: Not At Risk (03/13/2018)   Humiliation, Afraid, Rape, and Kick questionnaire    Fear of Current or Ex-Partner: No    Emotionally Abused: No    Physically Abused: No    Sexually Abused: No     Constitutional: Pt reports frequent headaches. Denies fever, malaise, fatigue,or abrupt weight changes.  HEENT: Denies eye pain, eye redness, ear pain, ringing in the ears, wax buildup, runny nose, nasal congestion, bloody nose, or sore throat. Respiratory: Denies difficulty breathing, shortness of breath, cough or sputum production.   Cardiovascular: Denies chest pain, chest tightness, palpitations or swelling in the hands or feet.  Gastrointestinal: Pt reports  intermittent constipation. Denies abdominal pain, bloating, diarrhea or blood in the stool.  GU: Denies urgency, frequency, pain with urination, burning sensation, blood in urine, odor or discharge. Musculoskeletal: Pt reports chronic muscle and joint pain. Denies decrease in range of motion, difficulty with gait, or joint swelling.  Skin: Denies redness, rashes, lesions or ulcercations.  Neurological: Denies dizziness, difficulty with memory, difficulty with speech or problems with balance and coordination.  Psych: Pt has a history of anxiety and depression. Denies SI/HI.  No other specific complaints in a complete review of systems (except as listed in HPI above).  Objective:   Physical Exam  BP 124/86 (BP Location: Left Arm, Patient Position: Sitting, Cuff Size: Normal)   Pulse (!) 57   Temp (!) 97.1 F (36.2 C) (Temporal)   Ht 5\' 6"  (1.676 m)   Wt 184 lb (83.5 kg)   SpO2 99%   BMI 29.70 kg/m   Wt Readings from Last 3 Encounters:  07/14/20 178 lb 12.8 oz (81.1 kg)  06/02/19 204 lb 11.2 oz (92.9 kg)  05/06/19 207 lb 9.6 oz (94.2 kg)    General: Appears her stated age, overweight, in NAD. Skin: Warm, dry and intact. HEENT: Head: normal shape and size; Eyes: sclera white, no icterus, conjunctiva pink, PERRLA and EOMs intact;  Neck:  Neck supple, trachea midline. No masses, lumps or thyromegaly present.  Cardiovascular: Normal rate and rhythm. S1,S2 noted.  No murmur, rubs or gallops noted. No JVD or BLE edema.  Pulmonary/Chest: Normal effort and positive vesicular breath sounds. No respiratory distress. No wheezes, rales or ronchi noted.  Abdomen: Normal bowel sounds.  Musculoskeletal: Strength 5/5 BUE/BLE. No difficulty with gait.  Neurological: Alert and oriented. Cranial nerves II-XII grossly intact. Coordination normal.  Psychiatric: Mood and affect normal. Behavior is normal. Judgment and thought content normal.    BMET    Component Value Date/Time   NA 141 05/11/2019  0855   K 4.1 05/11/2019 0855   CL 107 05/11/2019 0855   CO2 26 05/11/2019 0855   GLUCOSE 86 05/11/2019 0855   BUN 17 05/11/2019 0855   CREATININE 0.72 05/11/2019 0855   CALCIUM 9.1 05/11/2019 0855   GFRNONAA 103 05/11/2019 0855   GFRAA 120 05/11/2019 0855    Lipid Panel     Component Value Date/Time   CHOL 134 05/11/2019 0855   TRIG 103 05/11/2019 0855   HDL 37 (L) 05/11/2019 0855   CHOLHDL 3.6 05/11/2019 0855   LDLCALC 78 05/11/2019 0855    CBC    Component Value Date/Time   WBC 5.7 05/11/2019 0855   RBC 4.47 05/11/2019 0855   HGB 14.2 05/11/2019 0855   HCT 42.8 05/11/2019 0855   PLT 219 05/11/2019 0855   MCV 95.7 05/11/2019 0855   MCH 31.8 05/11/2019 0855   MCHC 33.2 05/11/2019 0855   RDW  12.8 05/11/2019 0855   LYMPHSABS 1,522 05/11/2019 0855   MONOABS 0.7 11/05/2018 0327   EOSABS 154 05/11/2019 0855   BASOSABS 29 05/11/2019 0855    Hgb A1C Lab Results  Component Value Date   HGBA1C 5.1 04/29/2018          Assessment & Plan:   Preventative Health Maintenance:  Encouraged her to get a flu shot in the fall She declines Tetanus Encouraged her to get a covid booster Pap smear UTD, will request record Mammogram UTD, will request record Referral to GI for screening colonoscopy Encouraged her to consume a balanced diet and exercise regimen Advised her to see an eye doctor and dentist annually Will check CBC, CMET, Lipid, A1C, HIV and Hep C today  Abdominal Pannus:  Referral to plastic surgery for further evaluation and treatment  RTC in 6 months, follow up chronic conditions Nicki Reaper, NP

## 2021-08-21 ENCOUNTER — Telehealth: Payer: Self-pay

## 2021-08-21 LAB — COMPLETE METABOLIC PANEL WITH GFR
AG Ratio: 1.7 (calc) (ref 1.0–2.5)
ALT: 11 U/L (ref 6–29)
AST: 15 U/L (ref 10–35)
Albumin: 4.2 g/dL (ref 3.6–5.1)
Alkaline phosphatase (APISO): 34 U/L (ref 31–125)
BUN: 22 mg/dL (ref 7–25)
CO2: 26 mmol/L (ref 20–32)
Calcium: 9.3 mg/dL (ref 8.6–10.2)
Chloride: 105 mmol/L (ref 98–110)
Creat: 0.79 mg/dL (ref 0.50–0.99)
Globulin: 2.5 g/dL (calc) (ref 1.9–3.7)
Glucose, Bld: 86 mg/dL (ref 65–139)
Potassium: 4.5 mmol/L (ref 3.5–5.3)
Sodium: 139 mmol/L (ref 135–146)
Total Bilirubin: 0.5 mg/dL (ref 0.2–1.2)
Total Protein: 6.7 g/dL (ref 6.1–8.1)
eGFR: 94 mL/min/{1.73_m2} (ref >=60)

## 2021-08-21 LAB — CBC
HCT: 40.1 % (ref 35.0–45.0)
Hemoglobin: 13.4 g/dL (ref 11.7–15.5)
MCH: 31 pg (ref 27.0–33.0)
MCHC: 33.4 g/dL (ref 32.0–36.0)
MCV: 92.8 fL (ref 80.0–100.0)
MPV: 9.9 fL (ref 7.5–12.5)
Platelets: 207 10*3/uL (ref 140–400)
RBC: 4.32 10*6/uL (ref 3.80–5.10)
RDW: 13.6 % (ref 11.0–15.0)
WBC: 5 10*3/uL (ref 3.8–10.8)

## 2021-08-21 LAB — HEPATITIS C ANTIBODY: Hepatitis C Ab: NONREACTIVE

## 2021-08-21 LAB — LIPID PANEL
Cholesterol: 147 mg/dL (ref <200)
HDL: 45 mg/dL — ABNORMAL LOW (ref >=50)
LDL Cholesterol (Calc): 83 mg/dL (calc)
Non-HDL Cholesterol (Calc): 102 mg/dL (calc) (ref <130)
Total CHOL/HDL Ratio: 3.3 (calc) (ref <5.0)
Triglycerides: 98 mg/dL (ref <150)

## 2021-08-21 LAB — ADVANCED WRITTEN NOTIFICATION (AWN) TEST REFUSAL: AWN TEST REFUSED: 16802

## 2021-08-21 LAB — TSH: TSH: 0.19 mIU/L — ABNORMAL LOW

## 2021-08-21 LAB — HIV ANTIBODY (ROUTINE TESTING W REFLEX): HIV 1&2 Ab, 4th Generation: NONREACTIVE

## 2021-08-21 LAB — HEMOGLOBIN A1C
Hgb A1c MFr Bld: 4.8 % of total Hgb (ref <5.7)
Mean Plasma Glucose: 91 mg/dL
eAG (mmol/L): 5 mmol/L

## 2021-08-21 LAB — T4, FREE: Free T4: 1.3 ng/dL (ref 0.8–1.8)

## 2021-08-21 NOTE — Telephone Encounter (Signed)
CALLED PATIENT NO ANSWER LEFT VOICEMAIL FOR A CALL BACK ? ?

## 2021-08-22 ENCOUNTER — Telehealth: Payer: Self-pay

## 2021-08-22 MED ORDER — LEVOTHYROXINE SODIUM 125 MCG PO TABS: 125.0000 ug | ORAL_TABLET | Freq: Every day | ORAL | 0 refills | Status: DC

## 2021-08-22 NOTE — Telephone Encounter (Signed)
CALLED PATIENT NO ANSWER LEFT VOICEMAIL FOR A CALL BACK  letter sent 

## 2021-08-22 NOTE — Addendum Note (Signed)
Addended by: Lorre Munroe on: 08/22/2021 10:10 AM   Modules accepted: Orders

## 2021-09-10 ENCOUNTER — Institutional Professional Consult (permissible substitution): Payer: Self-pay | Admitting: Plastic Surgery

## 2021-11-16 ENCOUNTER — Other Ambulatory Visit: Payer: Self-pay | Admitting: Internal Medicine

## 2021-11-16 NOTE — Telephone Encounter (Signed)
Requested medication (s) are due for refill today: yes  Requested medication (s) are on the active medication list: yes  Last refill:  08/22/21 #90 with 0 RF  Future visit scheduled: no, seen 08/20/21  Notes to clinic:  Dose changed 08/22/21 and asked to return in one month to repeat TSH, it is ordered but not drawn, please assess.  Requested Prescriptions  Pending Prescriptions Disp Refills   SYNTHROID 125 MCG tablet [Pharmacy Med Name: SYNTHROID 125 MCG TABLET] 90 tablet 0    Sig: TAKE 1 TABLET BY MOUTH EVERY DAY     Endocrinology:  Hypothyroid Agents Failed - 11/16/2021  2:26 AM      Failed - TSH in normal range and within 360 days    TSH  Date Value Ref Range Status  08/20/2021 0.19 (L) mIU/L Final    Comment:              Reference Range .           > or = 20 Years  0.40-4.50 .                Pregnancy Ranges           First trimester    0.26-2.66           Second trimester   0.55-2.73           Third trimester    0.43-2.91          Passed - Valid encounter within last 12 months    Recent Outpatient Visits           2 months ago Screening for colon cancer   [facility] [place], Salvadore Oxford, NP   1 year ago Acquired hypothyroidism   [facility] [place], Salvadore Oxford, NP   2 years ago Acquired hypothyroidism   [facility], Jodelle Gross, FNP   2 years ago Acquired hypothyroidism   [facility] Galen [place], NP   3 years ago Acute midline low back pain without sciatica   [facility] Gal[place], NP

## 2021-11-22 ENCOUNTER — Other Ambulatory Visit: Payer: Self-pay

## 2021-11-22 DIAGNOSIS — E039 Hypothyroidism, unspecified: Secondary | ICD-10-CM

## 2021-11-23 ENCOUNTER — Other Ambulatory Visit: Payer: BC Managed Care – PPO

## 2021-11-24 ENCOUNTER — Other Ambulatory Visit: Payer: Self-pay | Admitting: Internal Medicine

## 2021-11-24 LAB — TSH: TSH: 1.21 mIU/L

## 2021-11-26 ENCOUNTER — Other Ambulatory Visit: Payer: Self-pay | Admitting: Internal Medicine

## 2021-11-26 MED ORDER — LEVOTHYROXINE SODIUM 125 MCG PO TABS: 125.0000 ug | ORAL_TABLET | Freq: Every day | ORAL | 0 refills | Status: DC

## 2022-02-11 ENCOUNTER — Encounter: Payer: Self-pay | Admitting: Internal Medicine

## 2022-02-11 ENCOUNTER — Ambulatory Visit: Payer: BC Managed Care – PPO | Admitting: Internal Medicine

## 2022-02-11 VITALS — BP 134/84 | HR 71 | Temp 97.5°F | Wt 183.0 lb

## 2022-02-11 DIAGNOSIS — E039 Hypothyroidism, unspecified: Secondary | ICD-10-CM | POA: Diagnosis not present

## 2022-02-11 DIAGNOSIS — E663 Overweight: Secondary | ICD-10-CM | POA: Diagnosis not present

## 2022-02-11 DIAGNOSIS — Z1211 Encounter for screening for malignant neoplasm of colon: Secondary | ICD-10-CM

## 2022-02-11 DIAGNOSIS — Z6829 Body mass index (BMI) 29.0-29.9, adult: Secondary | ICD-10-CM

## 2022-02-11 DIAGNOSIS — B351 Tinea unguium: Secondary | ICD-10-CM

## 2022-02-11 MED ORDER — SYNTHROID 125 MCG PO TABS: 125.0000 ug | ORAL_TABLET | Freq: Every day | ORAL | 0 refills | Status: DC

## 2022-02-11 NOTE — Patient Instructions (Signed)

## 2022-02-11 NOTE — Assessment & Plan Note (Signed)
Encourage diet and exercise for weight loss 

## 2022-02-11 NOTE — Assessment & Plan Note (Signed)
Will change to brand-name Synthroid We will check TSH, free T4 and T3 in 1 month, lab only

## 2022-02-11 NOTE — Progress Notes (Signed)
Subjective:    Patient ID: Carolyn Warner, female    DOB: 06-02-1975, 46 y.o.   MRN: [id number]  HPI  Patient presents to clinic today for follow-up of chronic conditions.  Hypothyroidism: She reports she would like to switch to brand Synthroid. She does not feel like the Levothyroxine is effective. She does not follow with endocrinology.  She also thinks she has fungus of her left great toenail. She noticed this 1 year ago. She reports when it grows out, it is thick and yellow. She has tried the topical oil OTC with minimal relief of symptoms.  Review of Systems     Past Medical History:  Diagnosis Date   Anxiety    Bulging of intervertebral disc between L4 and L5    Common migraine with intractable migraine 05/31/2016   Depression    Headache    Thyroid disease     Current Outpatient Medications  Medication Sig Dispense Refill   Calcium-Vitamin D-Vitamin K (CALCIUM SOFT CHEWS PO) Take 1 each by mouth 3 (three) times daily.     levonorgestrel (MIRENA) 20 MCG/24HR IUD by Intrauterine route.     levothyroxine (SYNTHROID) 125 MCG tablet Take 1 tablet (125 mcg total) by mouth daily. 90 tablet 0   levothyroxine (SYNTHROID) 125 MCG tablet Take 1 tablet (125 mcg total) by mouth daily. 90 tablet 0   Multiple Vitamins-Minerals (BARIATRIC MULTIVITAMINS/IRON PO) Take by mouth.     No current facility-administered medications for this visit.    No Known Allergies  Family History  Problem Relation Age of Onset   Thyroid disease Mother    Healthy Father    Alzheimer's disease Maternal Grandmother    Thyroid cancer Maternal Grandmother    Stroke Paternal Grandmother    Thyroid disease Son    Healthy Maternal Grandfather     Social History   Socioeconomic History   Marital status: Married    Spouse name: Not on file   Number of children: 3   Years of education: College   Highest education level: Associate degree: occupational, Scientist, product/process development, or vocational program   Occupational History   Occupation: Administrator, [employer] care  Tobacco Use   Smoking status: Never   Smokeless tobacco: Never  Vaping Use   Vaping Use: Never used  Substance and Sexual Activity   Alcohol use: No   Drug use: No   Sexual activity: Yes    Birth control/protection: I.U.D.  Other Topics Concern   Not on file  Social History Narrative   Lives with 3 kids   Caffeine use: occasional (coffee)   Tea-half and half- once per week   Right-handed   Social Determinants of Health   Financial Resource Strain: Low Risk  (03/13/2018)   Overall Financial Resource Strain (CARDIA)    Difficulty of Paying Living Expenses: Not hard at all  Food Insecurity: No Food Insecurity (03/13/2018)   Hunger Vital Sign    Worried About Running Out of Food in the Last Year: Never true    Ran Out of Food in the Last Year: Never true  Transportation Needs: No Transportation Needs (03/13/2018)   PRAPARE - Administrator, Civil Service (Medical): No    Lack of Transportation (Non-Medical): No  Physical Activity: Insufficiently Active (03/13/2018)   Exercise Vital Sign    Days of Exercise per Week: 2 days    Minutes of Exercise per Session: 10 min  Stress: Not on file  Social Connections: Not on file  Intimate  Partner Violence: Not At Risk (03/13/2018)   Humiliation, Afraid, Rape, and Kick questionnaire    Fear of Current or Ex-Partner: No    Emotionally Abused: No    Physically Abused: No    Sexually Abused: No     Constitutional: Denies fever, malaise, fatigue, headache or abrupt weight changes.  Respiratory: Denies difficulty breathing, shortness of breath, cough or sputum production.   Cardiovascular: Denies chest pain, chest tightness, palpitations or swelling in the hands or feet.  Gastrointestinal: Denies abdominal pain, bloating, constipation, diarrhea or blood in the stool.  GU: Denies urgency, frequency, pain with urination, burning sensation, blood in urine, odor or  discharge. Musculoskeletal: Denies decrease in range of motion, difficulty with gait, muscle pain or joint pain and swelling.  Skin: Patient reports discoloration of left great toenail.  Denies redness, rashes, lesions or ulcercations.  Neurological: Denies dizziness, difficulty with memory, difficulty with speech or problems with balance and coordination.  Psych: Denies anxiety, depression, SI/HI.  No other specific complaints in a complete review of systems (except as listed in HPI above).  Objective:   Physical Exam   BP 134/84 (BP Location: Left Arm, Patient Position: Sitting, Cuff Size: Normal)   Pulse 71   Temp (!) 97.5 F (36.4 C) (Temporal)   Wt 183 lb (83 kg)   SpO2 100%   BMI 29.54 kg/m   Wt Readings from Last 3 Encounters:  08/20/21 184 lb (83.5 kg)  07/14/20 178 lb 12.8 oz (81.1 kg)  06/02/19 204 lb 11.2 oz (92.9 kg)    General: Appears her stated age, overweight in NAD. Skin: Thickness and discoloration noted of the left great toenail. HEENT: Head: normal shape and size; Eyes: sclera white, no icterus, conjunctiva pink, PERRLA and EOMs intact;  Neck:  Neck supple, trachea midline. No masses, lumps or thyromegaly present.  Cardiovascular: Normal rate and rhythm.  Pulmonary/Chest: Normal effort and positive vesicular breath sounds. No respiratory distress. No wheezes, rales or ronchi noted.  Musculoskeletal: No difficulty with gait.  Neurological: Alert and oriented.   BMET    Component Value Date/Time   NA 139 08/20/2021 1413   K 4.5 08/20/2021 1413   CL 105 08/20/2021 1413   CO2 26 08/20/2021 1413   GLUCOSE 86 08/20/2021 1413   BUN 22 08/20/2021 1413   CREATININE 0.79 08/20/2021 1413   CALCIUM 9.3 08/20/2021 1413   GFRNONAA 103 05/11/2019 0855   GFRAA 120 05/11/2019 0855    Lipid Panel     Component Value Date/Time   CHOL 147 08/20/2021 1413   TRIG 98 08/20/2021 1413   HDL 45 (L) 08/20/2021 1413   CHOLHDL 3.3 08/20/2021 1413   LDLCALC 83  08/20/2021 1413    CBC    Component Value Date/Time   WBC 5.0 08/20/2021 1413   RBC 4.32 08/20/2021 1413   HGB 13.4 08/20/2021 1413   HCT 40.1 08/20/2021 1413   PLT 207 08/20/2021 1413   MCV 92.8 08/20/2021 1413   MCH 31.0 08/20/2021 1413   MCHC 33.4 08/20/2021 1413   RDW 13.6 08/20/2021 1413   LYMPHSABS 1,522 05/11/2019 0855   MONOABS 0.7 11/05/2018 0327   EOSABS 154 05/11/2019 0855   BASOSABS 29 05/11/2019 0855    Hgb A1C Lab Results  Component Value Date   HGBA1C 4.8 08/20/2021           Assessment & Plan:   Fungal Infection of Toenail:  Discussed use of Terbinafine She would like referral to podiatry to see if  they can remove the nail first  RTC in 6 months for annual exam Nicki Reaper, NP

## 2022-02-19 ENCOUNTER — Encounter: Payer: Self-pay | Admitting: Internal Medicine

## 2022-02-19 MED ORDER — SYNTHROID 125 MCG PO TABS: 125.0000 ug | ORAL_TABLET | Freq: Every day | ORAL | 0 refills | Status: DC

## 2022-02-20 ENCOUNTER — Ambulatory Visit: Payer: BC Managed Care – PPO | Admitting: Podiatry

## 2022-02-27 LAB — COLOGUARD: COLOGUARD: NEGATIVE

## 2022-03-14 ENCOUNTER — Other Ambulatory Visit: Payer: Self-pay

## 2022-03-14 DIAGNOSIS — E039 Hypothyroidism, unspecified: Secondary | ICD-10-CM

## 2022-03-15 ENCOUNTER — Other Ambulatory Visit: Payer: BC Managed Care – PPO

## 2022-04-02 ENCOUNTER — Encounter: Payer: Self-pay | Admitting: Internal Medicine

## 2022-04-03 ENCOUNTER — Encounter: Payer: Self-pay | Admitting: Physician Assistant

## 2022-04-03 ENCOUNTER — Telehealth: Payer: BC Managed Care – PPO | Admitting: Physician Assistant

## 2022-04-03 DIAGNOSIS — U071 COVID-19: Secondary | ICD-10-CM | POA: Diagnosis not present

## 2022-04-03 MED ORDER — NIRMATRELVIR/RITONAVIR (PAXLOVID)TABLET: 3.0000 | ORAL_TABLET | Freq: Two times a day (BID) | ORAL | 0 refills | Status: AC

## 2022-04-03 NOTE — Patient Instructions (Signed)
[place] Silos, thank you for joining [name], PA-C for today's virtual visit.  While this provider is not your primary care provider (PCP), if your PCP is located in our provider database this encounter information will be shared with them immediately following your visit.   [facility] account gives you access to today's visit and all your visits, tests, and labs performed at [facility] " click here if you don't have a [facility] account or go to [email or web address]  Consent: (Patient) Carolyn Warner provided verbal consent for this virtual visit at the beginning of the encounter.  Current Medications:  Current Outpatient Medications:    Calcium-Vitamin D-Vitamin K (CALCIUM SOFT CHEWS PO), Take 1 each by mouth 3 (three) times daily., Disp: , Rfl:    levonorgestrel (MIRENA) 20 MCG/24HR IUD, by Intrauterine route., Disp: , Rfl:    Multiple Vitamins-Minerals (BARIATRIC MULTIVITAMINS/IRON PO), Take by mouth., Disp: , Rfl:    SYNTHROID 125 MCG tablet, Take 1 tablet (125 mcg total) by mouth daily before breakfast., Disp: 90 tablet, Rfl: 0   Medications ordered in this encounter:  No orders of the defined types were placed in this encounter.    *If you need refills on other medications prior to your next appointment, please contact your pharmacy*  Follow-Up: Call back or seek an in-person evaluation if the symptoms worsen or if the condition fails to improve as anticipated.  [place] [phone]  Care Instructions: Please keep well-hydrated and get plenty of rest. Start a saline nasal rinse to flush out your nasal passages. You can use plain Mucinex to help thin congestion. If you have a humidifier, running in the bedroom at night. I want you to start OTC vitamin D3 1000 units daily, vitamin C 1000 mg daily, and a zinc supplement. Please take prescribed medications as directed.      Isolation  Instructions: You are to isolate at home for 5 days from onset of your symptoms. If you must be around other household members who do not have symptoms, you need to make sure that both you and the family members are masking consistently with a high-quality mask.  After day 5 of isolation, if you have had no fever within 24 hours and you are feeling better, you can end isolation but need to mask for an additional 5 days.  After day 5 if you have a fever or are having significant symptoms, please isolate for full 10 days.  If you note any worsening of symptoms despite treatment, please seek an in-person evaluation ASAP. If you note any significant shortness of breath or any chest pain, please seek ER evaluation. Please do not delay care!   COVID-19: What to Do if You Are Sick If you test positive and are an older adult or someone who is at high risk of getting very sick from COVID-19, treatment may be available. Contact a healthcare provider right away after a positive test to determine if you are eligible, even if your symptoms are mild right now. You can also visit a Test to Treat location and, if eligible, receive a prescription from a provider. Don't delay: Treatment must be started within the first few days to be effective. If you have a fever, cough, or other symptoms, you might have COVID-19. Most people have mild illness and are able to recover at home. If you are sick: Keep track of your symptoms. If you have an emergency warning sign (including trouble  breathing), call 911. Steps to help prevent the spread of COVID-19 if you are sick If you are sick with COVID-19 or think you might have COVID-19, follow the steps below to care for yourself and to help protect other people in your home and community. Stay home except to get medical care Stay home. Most people with COVID-19 have mild illness and can recover at home without medical care. Do not leave your home, except to get medical care. Do  not visit public areas and do not go to places where you are unable to wear a mask. Take care of yourself. Get rest and stay hydrated. Take over-the-counter medicines, such as acetaminophen, to help you feel better. Stay in touch with your doctor. Call before you get medical care. Be sure to get care if you have trouble breathing, or have any other emergency warning signs, or if you think it is an emergency. Avoid public transportation, ride-sharing, or taxis if possible. Get tested If you have symptoms of COVID-19, get tested. While waiting for test results, stay away from others, including staying apart from those living in your household. Get tested as soon as possible after your symptoms start. Treatments may be available for people with COVID-19 who are at risk for becoming very sick. Don't delay: Treatment must be started early to be effective--some treatments must begin within 5 days of your first symptoms. Contact your healthcare provider right away if your test result is positive to determine if you are eligible. Self-tests are one of several options for testing for the virus that causes COVID-19 and may be more convenient than laboratory-based tests and point-of-care tests. Ask your healthcare provider or your local health department if you need help interpreting your test results. You can visit your state, tribal, local, and territorial health department's website to look for the latest local information on testing sites. Separate yourself from other people As much as possible, stay in a specific room and away from other people and pets in your home. If possible, you should use a separate bathroom. If you need to be around other people or animals in or outside of the home, wear a well-fitting mask. Tell your close contacts that they may have been exposed to COVID-19. An infected person can spread COVID-19 starting 48 hours (or 2 days) before the person has any symptoms or tests positive. By  letting your close contacts know they may have been exposed to COVID-19, you are helping to protect everyone. See COVID-19 and Animals if you have questions about pets. If you are diagnosed with COVID-19, someone from the health department may call you. Answer the call to slow the spread. Monitor your symptoms Symptoms of COVID-19 include fever, cough, or other symptoms. Follow care instructions from your healthcare provider and local health department. Your local health authorities may give instructions on checking your symptoms and reporting information. When to seek emergency medical attention Look for emergency warning signs* for COVID-19. If someone is showing any of these signs, seek emergency medical care immediately: Trouble breathing Persistent pain or pressure in the chest New confusion Inability to wake or stay awake Pale, gray, or blue-colored skin, lips, or nail beds, depending on skin tone *This list is not all possible symptoms. Please call your medical provider for any other symptoms that are severe or concerning to you. Call 911 or call ahead to your local emergency facility: Notify the operator that you are seeking care for someone who has or may have COVID-19. Call  ahead before visiting your doctor Call ahead. Many medical visits for routine care are being postponed or done by phone or telemedicine. If you have a medical appointment that cannot be postponed, call your doctor's office, and tell them you have or may have COVID-19. This will help the office protect themselves and other patients. If you are sick, wear a well-fitting mask You should wear a mask if you must be around other people or animals, including pets (even at home). Wear a mask with the best fit, protection, and comfort for you. You don't need to wear the mask if you are alone. If you can't put on a mask (because of trouble breathing, for example), cover your coughs and sneezes in some other way. Try to stay  at least 6 feet away from other people. This will help protect the people around you. Masks should not be placed on young children under age 8 years, anyone who has trouble breathing, or anyone who is not able to remove the mask without help. Cover your coughs and sneezes Cover your mouth and nose with a tissue when you cough or sneeze. Throw away used tissues in a lined trash can. Immediately wash your hands with soap and water for at least 20 seconds. If soap and water are not available, clean your hands with an alcohol-based hand sanitizer that contains at least 60% alcohol. Clean your hands often Wash your hands often with soap and water for at least 20 seconds. This is especially important after blowing your nose, coughing, or sneezing; going to the bathroom; and before eating or preparing food. Use hand sanitizer if soap and water are not available. Use an alcohol-based hand sanitizer with at least 60% alcohol, covering all surfaces of your hands and rubbing them together until they feel dry. Soap and water are the best option, especially if hands are visibly dirty. Avoid touching your eyes, nose, and mouth with unwashed hands. Handwashing Tips Avoid sharing personal household items Do not share dishes, drinking glasses, cups, eating utensils, towels, or bedding with other people in your home. Wash these items thoroughly after using them with soap and water or put in the dishwasher. Clean surfaces in your home regularly Clean and disinfect high-touch surfaces (for example, doorknobs, tables, handles, light switches, and countertops) in your "sick room" and bathroom. In shared spaces, you should clean and disinfect surfaces and items after each use by the person who is ill. If you are sick and cannot clean, a caregiver or other person should only clean and disinfect the area around you (such as your bedroom and bathroom) on an as needed basis. Your caregiver/other person should wait as long as  possible (at least several hours) and wear a mask before entering, cleaning, and disinfecting shared spaces that you use. Clean and disinfect areas that may have blood, stool, or body fluids on them. Use household cleaners and disinfectants. Clean visible dirty surfaces with household cleaners containing soap or detergent. Then, use a household disinfectant. Use a product from [employer] List N: Disinfectants for Coronavirus (COVID-19). Be sure to follow the instructions on the label to ensure safe and effective use of the product. Many products recommend keeping the surface wet with a disinfectant for a certain period of time (look at "contact time" on the product label). You may also need to wear personal protective equipment, such as gloves, depending on the directions on the product label. Immediately after disinfecting, wash your hands with soap and water for 20 seconds.  For completed guidance on cleaning and disinfecting your home, visit Complete Disinfection Guidance. Take steps to improve ventilation at home Improve ventilation (air flow) at home to help prevent from spreading COVID-19 to other people in your household. Clear out COVID-19 virus particles in the air by opening windows, using air filters, and turning on fans in your home. Use this interactive tool to learn how to improve air flow in your home. When you can be around others after being sick with COVID-19 Deciding when you can be around others is different for different situations. Find out when you can safely end home isolation. For any additional questions about your care, contact your healthcare provider or state or local health department. 05/23/2020 Content source: [facility] ([facility]), Division of Viral Diseases This information is not intended to replace advice given to you by your health care provider. Make sure you discuss any questions you have with your health care  provider. Document Revised: 07/06/2020 Document Reviewed: 07/06/2020 Elsevier Patient Education  2022 [employer].  If you have been instructed to have an in-person evaluation today at a local Urgent Care facility, please use the link below. It will take you to a list of all of our available [facility], including address, phone number and hours of operation. Please do not delay care.  [facility]  If you or a family member do not have a primary care provider, use the link below to schedule a visit and establish care. When you choose a [facility] primary care physician or advanced practice provider, you gain a long-term partner in health. Find a Primary Care Provider  Learn more about [facility]'s in-office and virtual care options: [facility]

## 2022-04-03 NOTE — Progress Notes (Signed)
Virtual Visit Consent   [name], you are scheduled for a virtual visit with a [facility] provider today. Just as with appointments in the office, your consent must be obtained to participate. Your consent will be active for this visit and any virtual visit you may have with one of our providers in the next 365 days. If you have a MyChart account, a copy of this consent can be sent to you electronically.  As this is a virtual visit, video technology does not allow for your provider to perform a traditional examination. This may limit your provider's ability to fully assess your condition. If your provider identifies any concerns that need to be evaluated in person or the need to arrange testing (such as labs, EKG, etc.), we will make arrangements to do so. Although advances in technology are sophisticated, we cannot ensure that it will always work on either your end or our end. If the connection with a video visit is poor, the visit may have to be switched to a telephone visit. With either a video or telephone visit, we are not always able to ensure that we have a secure connection.  By engaging in this virtual visit, you consent to the provision of healthcare and authorize for your insurance to be billed (if applicable) for the services provided during this visit. Depending on your insurance coverage, you may receive a charge related to this service.  I need to obtain your verbal consent now. Are you willing to proceed with your visit today? Carolyn Warner has provided verbal consent on 04/03/2022 for a virtual visit (video or telephone). Carolyn Warner, [place]  Date: 04/03/2022 9:58 AM  Virtual Visit via Video Note   I, Carolyn Warner, connected with  Carolyn Warner  ([id number], Feb 05, 1976) on 04/03/22 at  9:00 AM EST by a video-enabled telemedicine application and verified that I am speaking with the correct person using two identifiers.  Location: Patient: Virtual  Visit Location Patient: Home Provider: Virtual Visit Location Provider: Home Office   I discussed the limitations of evaluation and management by telemedicine and the availability of in person appointments. The patient expressed understanding and agreed to proceed.    History of Present Illness: Carolyn Warner is a 47 y.o. who identifies as a female who was assigned female at birth, and is being seen today for COVID-19. Symptoms starting Sunday with milder URI symptoms. Since that time symptoms have progressed with loss of taste and smell, worsening headache and increased head congestion. Over past day with body aches. Denies fever, chest pain or SOB. Denies GI symptoms. Exposure from a workout partner last week.     HPI: HPI  Problems:  Patient Active Problem List   Diagnosis Date Noted   Overweight with body mass index (BMI) of 29 to 29.9 in adult 08/20/2021   Chronic pain syndrome 10/05/2018   Acquired hypothyroidism 03/16/2018    Allergies: No Known Allergies Medications:  Current Outpatient Medications:    nirmatrelvir/ritonavir (PAXLOVID) 20 x 150 MG & 10 x 100MG  TABS, Take 3 tablets by mouth 2 (two) times daily for 5 days. (Take nirmatrelvir 150 mg two tablets twice daily for 5 days and ritonavir 100 mg one tablet twice daily for 5 days) Patient GFR is 94, Disp: 30 tablet, Rfl: 0   Calcium-Vitamin D-Vitamin K (CALCIUM SOFT CHEWS PO), Take 1 each by mouth 3 (three) times daily., Disp: , Rfl:    levonorgestrel (MIRENA) 20 MCG/24HR IUD, by Intrauterine  route., Disp: , Rfl:    Multiple Vitamins-Minerals (BARIATRIC MULTIVITAMINS/IRON PO), Take by mouth., Disp: , Rfl:    SYNTHROID 125 MCG tablet, Take 1 tablet (125 mcg total) by mouth daily before breakfast., Disp: 90 tablet, Rfl: 0  Observations/Objective: Patient is well-developed, well-nourished in no acute distress.  Resting comfortably at home.  Head is normocephalic, atraumatic.  No labored breathing. Speech is clear and  coherent with logical content.  Patient is alert and oriented at baseline.   Assessment and Plan: 1. COVID-19 - nirmatrelvir/ritonavir (PAXLOVID) 20 x 150 MG & 10 x 100MG  TABS; Take 3 tablets by mouth 2 (two) times daily for 5 days. (Take nirmatrelvir 150 mg two tablets twice daily for 5 days and ritonavir 100 mg one tablet twice daily for 5 days) Patient GFR is 94  Dispense: 30 tablet; Refill: 0  Patient with multiple risk factors for complicated course of illness. Discussed risks/benefits of antiviral medications including most common potential ADRs. Patient voiced understanding and would like to proceed with antiviral medication. They are candidate for Paxlovid with recent normal renal function. Rx sent to pharmacy. Supportive measures, OTC medications and vitamin regimen reviewed. Quarantine reviewed in detail. Strict ER precautions discussed with patient.    Follow Up Instructions: I discussed the assessment and treatment plan with the patient. The patient was provided an opportunity to ask questions and all were answered. The patient agreed with the plan and demonstrated an understanding of the instructions.  A copy of instructions were sent to the patient via MyChart unless otherwise noted below.   The patient was advised to call back or seek an in-person evaluation if the symptoms worsen or if the condition fails to improve as anticipated.  Time:  I spent 10 minutes with the patient via telehealth technology discussing the above problems/concerns.    Carolyn Rio, PA-C

## 2022-05-19 ENCOUNTER — Other Ambulatory Visit: Payer: Self-pay | Admitting: Internal Medicine

## 2022-05-20 NOTE — Telephone Encounter (Signed)
Requested Prescriptions  Pending Prescriptions Disp Refills   levothyroxine (SYNTHROID) 125 MCG tablet [Pharmacy Med Name: LEVOTHYROXINE 125 MCG TABLET] 90 tablet 1    Sig: TAKE 1 TABLET BY MOUTH EVERY DAY     Endocrinology:  Hypothyroid Agents Passed - 05/19/2022  8:41 AM      Passed - TSH in normal range and within 360 days    TSH  Date Value Ref Range Status  11/23/2021 1.21 mIU/L Final    Comment:              Reference Range .           > or = 20 Years  0.40-4.50 .                Pregnancy Ranges           First trimester    0.26-2.66           Second trimester   0.55-2.73           Third trimester    0.43-2.91          Passed - Valid encounter within last 12 months    Recent Outpatient Visits           3 months ago Acquired hypothyroidism   [facility] [place], Coralie Keens, NP   9 months ago Screening for colon cancer   [facility] [place], Coralie Keens, NP   1 year ago Acquired hypothyroidism   [facility] [place], Coralie Keens, NP   3 years ago Acquired hypothyroidism   [place], FNP   3 years ago Acquired hypothyroidism   [facility] [employer], NP

## 2023-02-15 ENCOUNTER — Other Ambulatory Visit: Payer: Self-pay | Admitting: Internal Medicine

## 2023-02-17 NOTE — Telephone Encounter (Signed)
Requested medication (s) are due for refill today: yes  Requested medication (s) are on the active medication list: yes    Last refill: 05/20/22  #90  1 refill  Future visit scheduled no  Notes to clinic: Pt needs appt. Attempted to reach to secure, left VM to call back.  Requested Prescriptions  Pending Prescriptions Disp Refills   SYNTHROID 125 MCG tablet [Pharmacy Med Name: SYNTHROID 125 MCG TABLET] 90 tablet 1    Sig: TAKE 1 TABLET BY MOUTH DAILY BEFORE BREAKFAST.     Endocrinology:  Hypothyroid Agents Failed - 02/17/2023 11:46 AM      Failed - TSH in normal range and within 360 days    TSH  Date Value Ref Range Status  11/23/2021 1.21 mIU/L Final    Comment:              Reference Range .           > or = 20 Years  0.40-4.50 .                Pregnancy Ranges           First trimester    0.26-2.66           Second trimester   0.55-2.73           Third trimester    0.43-2.91          Failed - Valid encounter within last 12 months    Recent Outpatient Visits           1 year ago Acquired hypothyroidism   [facility] [place], Salvadore Oxford, NP   1 year ago Screening for colon cancer   [facility] [place], Salvadore Oxford, NP   2 years ago Acquired hypothyroidism   [facility] [place], Salvadore Oxford, NP   3 years ago Acquired hypothyroidism   [facility], Jodelle Gross, FNP   4 years ago Acquired hypothyroidism   [facility] Galen [place], NP

## 2023-02-19 ENCOUNTER — Ambulatory Visit: Payer: BC Managed Care – PPO | Admitting: Internal Medicine

## 2023-03-03 ENCOUNTER — Encounter: Payer: Self-pay | Admitting: Internal Medicine

## 2023-03-03 ENCOUNTER — Ambulatory Visit: Payer: BC Managed Care – PPO | Admitting: Internal Medicine

## 2023-03-03 ENCOUNTER — Telehealth: Payer: Self-pay | Admitting: Internal Medicine

## 2023-03-03 VITALS — BP 112/74 | Ht 66.0 in | Wt 190.6 lb

## 2023-03-03 DIAGNOSIS — Z0001 Encounter for general adult medical examination with abnormal findings: Secondary | ICD-10-CM | POA: Diagnosis not present

## 2023-03-03 DIAGNOSIS — R739 Hyperglycemia, unspecified: Secondary | ICD-10-CM | POA: Diagnosis not present

## 2023-03-03 DIAGNOSIS — Z136 Encounter for screening for cardiovascular disorders: Secondary | ICD-10-CM

## 2023-03-03 DIAGNOSIS — Z6830 Body mass index (BMI) 30.0-30.9, adult: Secondary | ICD-10-CM

## 2023-03-03 DIAGNOSIS — E039 Hypothyroidism, unspecified: Secondary | ICD-10-CM

## 2023-03-03 DIAGNOSIS — E66811 Obesity, class 1: Secondary | ICD-10-CM

## 2023-03-03 DIAGNOSIS — E6609 Other obesity due to excess calories: Secondary | ICD-10-CM

## 2023-03-03 NOTE — Telephone Encounter (Signed)
Pt is checking out and was not able to schedule her lab due to her work schedule until 03/14/2023 so she was wanting to see if you could call in her medications.   Rx: levothyroxine (SYNTHROID) 125 MCG Pharm[facility] [place] on [address].

## 2023-03-03 NOTE — Patient Instructions (Signed)

## 2023-03-04 ENCOUNTER — Encounter: Payer: Self-pay | Admitting: Internal Medicine

## 2023-03-04 MED ORDER — LEVOTHYROXINE SODIUM 125 MCG PO TABS: 125.0000 ug | ORAL_TABLET | Freq: Every day | ORAL | 1 refills | Status: DC

## 2023-03-04 NOTE — Assessment & Plan Note (Signed)
 Encourage diet and exercise for weight loss

## 2023-03-04 NOTE — Telephone Encounter (Signed)
 Refilled

## 2023-03-04 NOTE — Addendum Note (Signed)
 Addended by: Lorre Munroe on: 03/04/2023 07:29 AM   Modules accepted: Orders

## 2023-03-14 ENCOUNTER — Other Ambulatory Visit: Payer: 59

## 2023-03-14 DIAGNOSIS — R739 Hyperglycemia, unspecified: Secondary | ICD-10-CM

## 2023-03-14 DIAGNOSIS — Z0001 Encounter for general adult medical examination with abnormal findings: Secondary | ICD-10-CM

## 2023-03-14 DIAGNOSIS — Z136 Encounter for screening for cardiovascular disorders: Secondary | ICD-10-CM

## 2023-03-14 DIAGNOSIS — E039 Hypothyroidism, unspecified: Secondary | ICD-10-CM

## 2023-03-15 LAB — COMPLETE METABOLIC PANEL WITH GFR
AG Ratio: 1.8 (calc) (ref 1.0–2.5)
ALT: 13 U/L (ref 6–29)
AST: 16 U/L (ref 10–35)
Albumin: 4.6 g/dL (ref 3.6–5.1)
Alkaline phosphatase (APISO): 41 U/L (ref 31–125)
BUN: 19 mg/dL (ref 7–25)
CO2: 29 mmol/L (ref 20–32)
Calcium: 9.6 mg/dL (ref 8.6–10.2)
Chloride: 104 mmol/L (ref 98–110)
Creat: 0.82 mg/dL (ref 0.50–0.99)
Globulin: 2.5 g/dL (ref 1.9–3.7)
Glucose, Bld: 78 mg/dL (ref 65–99)
Potassium: 3.9 mmol/L (ref 3.5–5.3)
Sodium: 140 mmol/L (ref 135–146)
Total Bilirubin: 0.6 mg/dL (ref 0.2–1.2)
Total Protein: 7.1 g/dL (ref 6.1–8.1)
eGFR: 89 mL/min/{1.73_m2} (ref >=60)

## 2023-03-15 LAB — CBC
HCT: 41.8 % (ref 35.0–45.0)
Hemoglobin: 14.4 g/dL (ref 11.7–15.5)
MCH: 33.1 pg — ABNORMAL HIGH (ref 27.0–33.0)
MCHC: 34.4 g/dL (ref 32.0–36.0)
MCV: 96.1 fL (ref 80.0–100.0)
MPV: 10 fL (ref 7.5–12.5)
Platelets: 261 10*3/uL (ref 140–400)
RBC: 4.35 10*6/uL (ref 3.80–5.10)
RDW: 13 % (ref 11.0–15.0)
WBC: 5.1 10*3/uL (ref 3.8–10.8)

## 2023-03-15 LAB — LIPID PANEL
Cholesterol: 169 mg/dL (ref <200)
HDL: 48 mg/dL — ABNORMAL LOW (ref >=50)
LDL Cholesterol (Calc): 99 mg/dL
Non-HDL Cholesterol (Calc): 121 mg/dL (ref <130)
Total CHOL/HDL Ratio: 3.5 (calc) (ref <5.0)
Triglycerides: 121 mg/dL (ref <150)

## 2023-03-15 LAB — HEMOGLOBIN A1C
Hgb A1c MFr Bld: 5 %{Hb} (ref <5.7)
Mean Plasma Glucose: 97 mg/dL
eAG (mmol/L): 5.4 mmol/L

## 2023-03-15 LAB — T4, FREE: Free T4: 1.2 ng/dL (ref 0.8–1.8)

## 2023-03-15 LAB — TSH: TSH: 1.98 m[IU]/L

## 2023-06-01 ENCOUNTER — Other Ambulatory Visit: Payer: Self-pay | Admitting: Internal Medicine

## 2023-06-03 NOTE — Telephone Encounter (Signed)
 Requested medication (s) are due for refill today: see below  Requested medication (s) are on the active medication list: yes  Last refill:  03/04/23  Future visit scheduled: yes  Notes to clinic:  Pharmacy comment: Product Backordered/Unavailable:PLEASE RESEND RX SO WE CAN CHANGE THE BRAND PER [place] LAW. MYLAN BRAND NOT AVAIABLE.     Requested Prescriptions  Pending Prescriptions Disp Refills   levothyroxine (SYNTHROID) 125 MCG tablet [Pharmacy Med Name: LEVOTHYROXINE 125 MCG TABLET] 90 tablet 1    Sig: TAKE 1 TABLET BY MOUTH EVERY DAY     There is no refill protocol information for this order

## 2023-06-15 ENCOUNTER — Other Ambulatory Visit: Payer: Self-pay | Admitting: Internal Medicine

## 2023-06-16 ENCOUNTER — Other Ambulatory Visit: Payer: Self-pay

## 2023-06-16 ENCOUNTER — Encounter: Payer: Self-pay | Admitting: Internal Medicine

## 2023-06-16 MED ORDER — LEVOTHYROXINE SODIUM 125 MCG PO TABS: 125.0000 ug | ORAL_TABLET | Freq: Every day | ORAL | 1 refills | Status: DC

## 2023-06-16 NOTE — Telephone Encounter (Signed)
 Requested Prescriptions  Refused Prescriptions Disp Refills   SYNTHROID 125 MCG tablet [Pharmacy Med Name: SYNTHROID 125 MCG TABLET] 90 tablet 1    Sig: TAKE 1 TABLET BY MOUTH DAILY BEFORE BREAKFAST.     Endocrinology:  Hypothyroid Agents Failed - 06/16/2023  5:48 PM      Failed - Valid encounter within last 12 months    Recent Outpatient Visits   None     Future Appointments             In 2 months Baity, Rankin Buzzard, NP [facility]            Passed - TSH in normal range and within 360 days    TSH  Date Value Ref Range Status  03/14/2023 1.98 mIU/L Final    Comment:              Reference Range .           > or = 20 Years  0.40-4.50 .                Pregnancy Ranges           First trimester    0.26-2.66           Second trimester   0.55-2.73           Third trimester    0.43-2.91

## 2023-07-31 ENCOUNTER — Encounter: Payer: Self-pay | Admitting: Internal Medicine

## 2023-07-31 ENCOUNTER — Ambulatory Visit (INDEPENDENT_AMBULATORY_CARE_PROVIDER_SITE_OTHER): Payer: Self-pay | Admitting: Internal Medicine

## 2023-07-31 VITALS — BP 110/72 | Ht 66.0 in | Wt 190.2 lb

## 2023-07-31 DIAGNOSIS — K148 Other diseases of tongue: Secondary | ICD-10-CM

## 2023-07-31 DIAGNOSIS — K149 Disease of tongue, unspecified: Secondary | ICD-10-CM

## 2023-07-31 MED ORDER — NYSTATIN 100000 UNIT/ML MT SUSP: 5.0000 mL | Freq: Four times a day (QID) | OROMUCOSAL | 0 refills | Status: AC

## 2023-07-31 MED ORDER — CHLORHEXIDINE GLUCONATE 0.12 % MT SOLN: 15.0000 mL | Freq: Two times a day (BID) | OROMUCOSAL | 0 refills | Status: DC

## 2023-07-31 NOTE — Progress Notes (Signed)
 Subjective:    Patient ID: Carolyn Warner, female    DOB: [birth year], 48 y.o.   MRN: [id number]  HPI  Discussed the use of AI scribe software for clinical note transcription with the patient, who gave verbal consent to proceed.   Carolyn Warner is a 48 year old female who presents with a year-long irritation on her tongue.  She has experienced a constant irritation on her tongue for approximately a year, primarily at the tip, which is persistently red. There is also a spot on the left side of her tongue that becomes puffy and swollen, causing discomfort. The irritation intensifies throughout the day, especially after eating and drinking, and is described as feeling 'on fire'.  Initially, she associated the irritation with consuming acidic foods, such as a cottage cheese and pineapple snack, but has since tried eliminating potential irritants like flavored water and certain foods without relief. Despite consuming only liquids like protein shakes and water, the irritation persists by the end of the day. Cold foods like yogurt or ice cream provide temporary relief from the burning sensation.  She has not noticed any discoloration or coating on her tongue and denies symptoms of acid reflux. She has not been on steroids or antibiotics frequently, aside from a brief course of oxycodone  following achilles tendon surgery last year. She experiences the irritation daily, although the intensity varies, with some days being less bothersome than others.  She has consulted her dentist about the issue during routine cleanings, but reports that nothing was done. There is no family history of similar oral issues, and she has not experienced any swelling of glands or dental problems. She has tried changing her toothpaste, but brushing her teeth still exacerbates the burning sensation. She denies smoking, chewing tobacco, or alcohol use.       Review of Systems   Past Medical History:  Diagnosis  Date   Anxiety    Bulging of intervertebral disc between L4 and L5    Common migraine with intractable migraine 05/31/2016   Depression    Headache    Thyroid  disease     Current Outpatient Medications  Medication Sig Dispense Refill   Calcium-Vitamin D -Vitamin K (CALCIUM SOFT CHEWS PO) Take 1 each by mouth 3 (three) times daily.     levonorgestrel (MIRENA) 20 MCG/24HR IUD by Intrauterine route.     levothyroxine  (SYNTHROID ) 125 MCG tablet Take 1 tablet (125 mcg total) by mouth daily. 90 tablet 1   Multiple Vitamins-Minerals (BARIATRIC MULTIVITAMINS/IRON PO) Take by mouth.     No current facility-administered medications for this visit.    No Known Allergies  Family History  Problem Relation Age of Onset   Thyroid  disease Mother    Healthy Father    Alzheimer's disease Maternal Grandmother    Thyroid  cancer Maternal Grandmother    Stroke Paternal Grandmother    Thyroid  disease Son    Healthy Maternal Grandfather     Social History   Socioeconomic History   Marital status: Married    Spouse name: Not on file   Number of children: 3   Years of education: College   Highest education level: Associate degree: occupational, Scientist, product/process development, or vocational program  Occupational History   Occupation: Administrator, Civil Service care  Tobacco Use   Smoking status: Never   Smokeless tobacco: Never  Vaping Use   Vaping status: Never Used  Substance and Sexual Activity   Alcohol use: No   Drug use: No   Sexual activity:  Yes    Birth control/protection: I.U.D.  Other Topics Concern   Not on file  Social History Narrative   Lives with 3 kids   Caffeine use: occasional (coffee)   Tea-half and half- once per week   Right-handed   Social Drivers of Health   Financial Resource Strain: Low Risk  (03/13/2018)   Overall Financial Resource Strain (CARDIA)    Difficulty of Paying Living Expenses: Not hard at all  Food Insecurity: No Food Insecurity (03/13/2018)   Hunger Vital Sign    Worried  About Running Out of Food in the Last Year: Never true    Ran Out of Food in the Last Year: Never true  Transportation Needs: No Transportation Needs (03/13/2018)   PRAPARE - Administrator, Civil Service (Medical): No    Lack of Transportation (Non-Medical): No  Physical Activity: Insufficiently Active (03/13/2018)   Exercise Vital Sign    Days of Exercise per Week: 2 days    Minutes of Exercise per Session: 10 min  Stress: Not on file  Social Connections: Not on file  Intimate Partner Violence: Not At Risk (03/13/2018)   Humiliation, Afraid, Rape, and Kick questionnaire    Fear of Current or Ex-Partner: No    Emotionally Abused: No    Physically Abused: No    Sexually Abused: No     Constitutional: Denies fever, malaise, fatigue, headache or abrupt weight changes.  HEENT: Pt reports tongue irritation. Denies eye pain, eye redness, ear pain, ringing in the ears, wax buildup, runny nose, nasal congestion, bloody nose, or sore throat. Respiratory: Denies difficulty breathing, shortness of breath, cough or sputum production.   Cardiovascular: Denies chest pain, chest tightness, palpitations or swelling in the hands or feet.  Neurological: Denies dizziness, difficulty with memory, difficulty with speech or problems with balance and coordination.    No other specific complaints in a complete review of systems (except as listed in HPI above).      Objective:   Physical Exam  BP 110/72 (BP Location: Left Arm, Patient Position: Sitting, Cuff Size: Normal)   Ht 5\' 6"  (1.676 m)   Wt 190 lb 4 oz (86.3 kg)   LMP  (LMP Unknown)   BMI 30.71 kg/m   Wt Readings from Last 3 Encounters:  03/03/23 190 lb 9.6 oz (86.5 kg)  02/11/22 183 lb (83 kg)  08/20/21 184 lb (83.5 kg)    General: Appears her stated age, obese, in NAD. HEENT: Head: normal shape and size; Eyes: sclera white, no icterus, conjunctiva pink, PERRLA and EOMs intact; Throat/Mouth: Teeth present, mucosa pink and  moist, redness noted underneath the tongue spreading up towards the tip.  0.5 cm nodule noted to the left lateral tongue.     Neck: No adenopathy noted. Cardiovascular: Normal rate and rhythm.  Pulmonary/Chest: Normal effort and positive vesicular breath sounds. No respiratory distress. No wheezes, rales or ronchi noted.  Neurological: Alert and oriented.   BMET    Component Value Date/Time   NA 140 03/14/2023 0807   K 3.9 03/14/2023 0807   CL 104 03/14/2023 0807   CO2 29 03/14/2023 0807   GLUCOSE 78 03/14/2023 0807   BUN 19 03/14/2023 0807   CREATININE 0.82 03/14/2023 0807   CALCIUM 9.6 03/14/2023 0807   GFRNONAA 103 05/11/2019 0855   GFRAA 120 05/11/2019 0855    Lipid Panel     Component Value Date/Time   CHOL 169 03/14/2023 0807   TRIG 121 03/14/2023 0807  HDL 48 (L) 03/14/2023 0807   CHOLHDL 3.5 03/14/2023 0807   LDLCALC 99 03/14/2023 0807    CBC    Component Value Date/Time   WBC 5.1 03/14/2023 0807   RBC 4.35 03/14/2023 0807   HGB 14.4 03/14/2023 0807   HCT 41.8 03/14/2023 0807   PLT 261 03/14/2023 0807   MCV 96.1 03/14/2023 0807   MCH 33.1 (H) 03/14/2023 0807   MCHC 34.4 03/14/2023 0807   RDW 13.0 03/14/2023 0807   LYMPHSABS 1,522 05/11/2019 0855   MONOABS 0.7 11/05/2018 0327   EOSABS 154 05/11/2019 0855   BASOSABS 29 05/11/2019 0855    Hgb A1C Lab Results  Component Value Date   HGBA1C 5.0 03/14/2023            Assessment & Plan:  Assessment and Plan    Tongue irritation, lesion of tingue Chronic tongue irritation and redness for one year with burning sensation. Differential includes atypical thrush and food allergy. No clear etiology.  - Prescribed Peridex  .12 % mouth rinse, swish and spit twice daily for one week. - If no improvement, prescribe nystatin mouthwash, 100,000 units, four times daily for one week. - Consider oral surgery referral for lesion removal if symptoms persist. - Consider ENT referral if no improvement with mouth  rinses.      RTC in 2 months for follow-up of chronic conditions Helayne Lo, NP

## 2023-07-31 NOTE — Patient Instructions (Signed)
Oral Thrush, Adult Oral thrush is an infection in your mouth and throat and on your tongue. It causes white patches to form in your mouth and on your tongue. Many cases of thrush are mild. But, sometimes, thrush can be serious. People who have a weak body defense system (immune system) or other diseases can be affected more. What are the causes? This condition is caused by a type of fungus called yeast. The fungus is normally present in small amounts in the mouth and nose. If a person has a long-term illness or a weak body defense system, the fungus can grow and spread quickly. This causes thrush. What increases the risk? You are more likely to develop this condition if: You have a weak body defense system. You are an older adult. You have diabetes, cancer, or HIV. You have a dry mouth. You are pregnant or breastfeeding. You do not take good care of your teeth. This risk is greater for people who have false teeth (dentures). You use antibiotic or steroid medicines. What are the signs or symptoms? Symptoms of this condition include: A burning feeling in the mouth and throat. White patches that stick to the mouth and tongue. A bad taste in the mouth or trouble tasting foods. A feeling like you have cotton in your mouth. Pain when you eat and swallow. Not wanting to eat as much as usual. Cracking at the corners of the mouth. How is this treated? This condition is treated with medicines called antifungals. These medicines prevent a fungus from growing. The medicines are either put right on the area (topical) or swallowed (oral). Your doctor will also treat other problems that you may have, such as diabetes or HIV. Follow these instructions at home: Helping with pain and soreness To lessen your pain: Drink cold liquids, like water and iced tea. Eat frozen ice pops or frozen juices. Eat foods that are easy to swallow, like gelatin and ice cream. Drink from a straw if you have too much pain  in your mouth.  General instructions Take or use over-the-counter and prescription medicines only as told by your doctor. Eat plain yogurt that has live cultures in it. Read the label to make sure that there are live cultures in your yogurt. If you wear false teeth: Take them out before you go to bed. Brush them well. Soak them in a cleaner. Rinse your mouth with warm salt-water many times a day. To make the salt-water mixture, dissolve -1 teaspoon (3-6 g) of salt in 1 cup (237 mL) of warm water. Contact a doctor if: Your problems do not get better within 7 days of treatment. Your infection is spreading. This may show as white areas on the skin outside of your mouth. You are nursing your baby and you have redness and pain in the nipples. Summary Oral thrush is an infection in your mouth and throat. It is caused by a fungus. You are more likely to get this condition if you have a weak body defense system. Diseases like diabetes, cancer, or HIV also add to your risk. This condition is treated with medicines called antifungals. Contact a doctor if you do not get better within 7 days of starting treatment. This information is not intended to replace advice given to you by your health care provider. Make sure you discuss any questions you have with your health care provider. Document Revised: 02/04/2022 Document Reviewed: 02/04/2022 Elsevier Patient Education  2024 [employer].

## 2023-09-02 ENCOUNTER — Ambulatory Visit: Payer: Self-pay | Admitting: Internal Medicine

## 2023-09-02 ENCOUNTER — Encounter: Payer: Self-pay | Admitting: Internal Medicine

## 2023-09-02 ENCOUNTER — Ambulatory Visit: Admitting: Internal Medicine

## 2023-09-02 VITALS — BP 124/80 | Ht 66.0 in | Wt 190.4 lb

## 2023-09-02 DIAGNOSIS — E039 Hypothyroidism, unspecified: Secondary | ICD-10-CM | POA: Diagnosis not present

## 2023-09-02 DIAGNOSIS — Z6830 Body mass index (BMI) 30.0-30.9, adult: Secondary | ICD-10-CM

## 2023-09-02 DIAGNOSIS — E6609 Other obesity due to excess calories: Secondary | ICD-10-CM

## 2023-09-02 DIAGNOSIS — E66811 Obesity, class 1: Secondary | ICD-10-CM

## 2023-09-02 MED ORDER — PREDNISONE 10 MG PO TABS: ORAL_TABLET | ORAL | 0 refills | Status: AC

## 2023-09-02 NOTE — Patient Instructions (Signed)
 RICE Therapy for Routine Care of Injuries Many injuries can be cared for with rest, ice, compression, and elevation. This is also called RICE therapy. RICE therapy includes: Resting the injured body part. Putting ice on the injury. Putting pressure on the injury. This is also called compression. Raising the injured part. This is also called elevation. RICE therapy can help reduce pain and swelling. Supplies needed: Ice. Plastic bag. Towel. Elastic bandage. Pillow or pillows to raise the injured body part. How to care for your injury with RICE therapy Rest Try to rest the injured part of your body. You can go back to your normal activities when your health care provider says it's okay to do them and when you can do them without pain. Ask what things are safe for you to do. Some injuries heal better with early movement instead of resting. If you rest the injury too much, it may not heal as well. Ask your provider if you should do exercises to help your injury get better. Ice Putting ice on your injury can help to lessen swelling and pain. Do not apply ice directly to your skin. Use ice on as many days as told by your provider. If told, put ice on the area. Put ice in a plastic bag. Place a towel between your skin and the bag. Leave the ice on for 20 minutes, 2-3 times a day. If your skin turns bright red, take off the ice right away to prevent skin damage. The risk of damage is higher if you can't feel pain, heat, or cold.  Compression Put pressure, also called compression, on your injured area. This can be done with an elastic bandage. If this type of bandage has been put on your injury: Follow instructions on the package the bandage came in about how to use it. Do not wrap the bandage too tightly. Wrap the bandage more loosely if part of your body beyond the bandage looks blue, or is swollen, cold, painful, or loses feeling. Take off the bandage and put it on again every 3-4 hours or  as told by your provider. Call your provider if the bandage seems to make your injury worse.  Elevation Raise the injured area above the level of your heart while you're sitting or lying down. Use a pillow to support your injured area as needed. Follow these instructions at home: If your symptoms get worse or last a long time, make a follow-up appointment with your provider. You may need to have imaging tests, such as X-rays or an MRI. If you have imaging tests, ask how to get your results when they are ready. Contact a health care provider if: You keep having pain and swelling. Your symptoms get worse. Get help right away if: You have sudden, very bad pain at your injury or lower than your injury. You have tingling or numbness at your injury or lower than your injury, and it does not go away when you take the bandage off. This information is not intended to replace advice given to you by your health care provider. Make sure you discuss any questions you have with your health care provider. Document Revised: 11/21/2022 Document Reviewed: 05/06/2022 Elsevier Patient Education  2024 [employer].

## 2023-09-02 NOTE — Assessment & Plan Note (Signed)
 Encourage diet and exercise for weight loss

## 2023-09-02 NOTE — Progress Notes (Signed)
 Subjective:    Patient ID: Carolyn Warner, female    DOB: 1975/08/14, 48 y.o.   MRN: [id number]  HPI  Patient presents to clinic today for follow-up of chronic conditions.  Hypothyroidism: She denies any issues on her current dose of levothyroxine .  She does not follow with endocrinology.  She also reorts right knee pain. This started 1-2 days ago. She describes the pain as sore. The pain is worse with weight bearing. She has noticed some swelling. She denies any specific injury to the area but does walk/run daily. She has tried ice and has ordered a brace but it has not arrived yet. She can not take NSAID's.   Review of Systems     Past Medical History:  Diagnosis Date   Anxiety    Bulging of intervertebral disc between L4 and L5    Common migraine with intractable migraine 05/31/2016   Depression    Headache    Thyroid  disease     Current Outpatient Medications  Medication Sig Dispense Refill   Calcium-Vitamin D -Vitamin K (CALCIUM SOFT CHEWS PO) Take 1 each by mouth 3 (three) times daily.     chlorhexidine  (PERIDEX ) 0.12 % solution Use as directed 15 mLs in the mouth or throat 2 (two) times daily. 120 mL 0   levonorgestrel (MIRENA) 20 MCG/24HR IUD by Intrauterine route.     levothyroxine  (SYNTHROID ) 125 MCG tablet Take 1 tablet (125 mcg total) by mouth daily. 90 tablet 1   Multiple Vitamins-Minerals (BARIATRIC MULTIVITAMINS/IRON PO) Take by mouth.     nystatin  (MYCOSTATIN ) 100000 UNIT/ML suspension Take 5 mLs (500,000 Units total) by mouth 4 (four) times daily. 120 mL 0   No current facility-administered medications for this visit.    No Known Allergies  Family History  Problem Relation Age of Onset   Thyroid  disease Mother    Healthy Father    Alzheimer's disease Maternal Grandmother    Thyroid  cancer Maternal Grandmother    Stroke Paternal Grandmother    Thyroid  disease Son    Healthy Maternal Grandfather     Social History   Socioeconomic History    Marital status: Married    Spouse name: Not on file   Number of children: 3   Years of education: College   Highest education level: Associate degree: occupational, Scientist, product/process development, or vocational program  Occupational History   Occupation: Administrator, [employer] care  Tobacco Use   Smoking status: Never   Smokeless tobacco: Never  Vaping Use   Vaping status: Never Used  Substance and Sexual Activity   Alcohol use: No   Drug use: No   Sexual activity: Yes    Birth control/protection: I.U.D.  Other Topics Concern   Not on file  Social History Narrative   Lives with 3 kids   Caffeine use: occasional (coffee)   Tea-half and half- once per week   Right-handed   Social Drivers of Health   Financial Resource Strain: Low Risk  (03/13/2018)   Overall Financial Resource Strain (CARDIA)    Difficulty of Paying Living Expenses: Not hard at all  Food Insecurity: No Food Insecurity (03/13/2018)   Hunger Vital Sign    Worried About Running Out of Food in the Last Year: Never true    Ran Out of Food in the Last Year: Never true  Transportation Needs: No Transportation Needs (03/13/2018)   PRAPARE - Administrator, Civil Service (Medical): No    Lack of Transportation (Non-Medical): No  Physical Activity:  Insufficiently Active (03/13/2018)   Exercise Vital Sign    Days of Exercise per Week: 2 days    Minutes of Exercise per Session: 10 min  Stress: Not on file  Social Connections: Not on file  Intimate Partner Violence: Not At Risk (03/13/2018)   Humiliation, Afraid, Rape, and Kick questionnaire    Fear of Current or Ex-Partner: No    Emotionally Abused: No    Physically Abused: No    Sexually Abused: No     Constitutional: Denies fever, malaise, fatigue, headache or abrupt weight changes.  Respiratory: Denies difficulty breathing, shortness of breath, cough or sputum production.   Cardiovascular: Denies chest pain, chest tightness, palpitations or swelling in the hands or feet.   Musculoskeletal: Patient reports right knee pain, swelling and decrease in range of motion.  Denies difficulty with gait, muscle pain.  Skin: Denies redness, rashes, lesions or ulcercations.  Neurological: Denies dizziness, difficulty with memory, difficulty with speech or problems with balance and coordination.    No other specific complaints in a complete review of systems (except as listed in HPI above).  Objective:   Physical Exam BP 124/80 (BP Location: Left Arm, Patient Position: Sitting, Cuff Size: Normal)   Ht 5' 6 (1.676 m)   Wt 190 lb 6.4 oz (86.4 kg)   LMP  (LMP Unknown)   BMI 30.73 kg/m    Wt Readings from Last 3 Encounters:  07/31/23 190 lb 4 oz (86.3 kg)  03/03/23 190 lb 9.6 oz (86.5 kg)  02/11/22 183 lb (83 kg)    General: Appears her stated age, obese, in NAD. Neck:  Neck supple, trachea midline. No masses, lumps or thyromegaly present.  Cardiovascular: Normal rate and rhythm.  Pulmonary/Chest: Normal effort and positive vesicular breath sounds. No respiratory distress. No wheezes, rales or ronchi noted.  Musculoskeletal: Normal flexion of the right knee.  Decreased extension of the right knee.  Swelling noted of the medial and lateral knee.  No pain with palpation of the knee.  No popliteal fullness noted.  No difficulty with gait.  Neurological: Alert and oriented.   BMET    Component Value Date/Time   NA 140 03/14/2023 0807   K 3.9 03/14/2023 0807   CL 104 03/14/2023 0807   CO2 29 03/14/2023 0807   GLUCOSE 78 03/14/2023 0807   BUN 19 03/14/2023 0807   CREATININE 0.82 03/14/2023 0807   CALCIUM 9.6 03/14/2023 0807   GFRNONAA 103 05/11/2019 0855   GFRAA 120 05/11/2019 0855    Lipid Panel     Component Value Date/Time   CHOL 169 03/14/2023 0807   TRIG 121 03/14/2023 0807   HDL 48 (L) 03/14/2023 0807   CHOLHDL 3.5 03/14/2023 0807   LDLCALC 99 03/14/2023 0807    CBC    Component Value Date/Time   WBC 5.1 03/14/2023 0807   RBC 4.35 03/14/2023  0807   HGB 14.4 03/14/2023 0807   HCT 41.8 03/14/2023 0807   PLT 261 03/14/2023 0807   MCV 96.1 03/14/2023 0807   MCH 33.1 (H) 03/14/2023 0807   MCHC 34.4 03/14/2023 0807   RDW 13.0 03/14/2023 0807   LYMPHSABS 1,522 05/11/2019 0855   MONOABS 0.7 11/05/2018 0327   EOSABS 154 05/11/2019 0855   BASOSABS 29 05/11/2019 0855    Hgb A1C Lab Results  Component Value Date   HGBA1C 5.0 03/14/2023           Assessment & Plan:   Acute right knee pain and swelling:  Recommend  taking a break from your daily walk/run Recommend ice for 10 minutes 2 times daily Recommend compression with a knee brace to help reduce swelling Rx for Pred taper x 6 days for symptom management Consider x-ray right knee if symptoms persist or worsen  RTC in 6 months for your annual exam Angeline Laura, NP

## 2023-09-02 NOTE — Assessment & Plan Note (Signed)
 She declines TSH and free T4 today Continue levothyroxine  125 mcg daily

## 2023-12-18 ENCOUNTER — Other Ambulatory Visit: Payer: Self-pay | Admitting: Internal Medicine

## 2023-12-19 NOTE — Telephone Encounter (Signed)
 Requested Prescriptions  Pending Prescriptions Disp Refills   levothyroxine  (SYNTHROID ) 125 MCG tablet [Pharmacy Med Name: LEVOTHYROXINE  125 MCG TABLET] 90 tablet 0    Sig: TAKE 1 TABLET BY MOUTH EVERY DAY     Endocrinology:  Hypothyroid Agents Passed - 12/19/2023  1:28 PM      Passed - TSH in normal range and within 360 days    TSH  Date Value Ref Range Status  03/14/2023 1.98 mIU/L Final    Comment:              Reference Range .           > or = 20 Years  0.40-4.50 .                Pregnancy Ranges           First trimester    0.26-2.66           Second trimester   0.55-2.73           Third trimester    0.43-2.91          Passed - Valid encounter within last 12 months    Recent Outpatient Visits           3 months ago Acquired hypothyroidism   [facility] [place], Angeline ORN, NP   4 months ago Tongue irritation    [facility] [place], Angeline ORN, [place]

## 2024-02-02 ENCOUNTER — Ambulatory Visit: Admitting: Podiatry

## 2024-02-05 ENCOUNTER — Ambulatory Visit: Admitting: Podiatry

## 2024-02-05 DIAGNOSIS — B351 Tinea unguium: Secondary | ICD-10-CM

## 2024-02-05 DIAGNOSIS — Z79899 Other long term (current) drug therapy: Secondary | ICD-10-CM

## 2024-02-05 NOTE — Progress Notes (Unsigned)
 Subjective:  Patient ID: Carolyn Warner, female    DOB: Apr 16, 1975,  MRN: [id number]  Chief Complaint  Patient presents with   Nail Problem    Left hallux nail     48 y.o. female presents with the above complaint.  Patient presents with thickened and dystrophic mycotic toenails x 1.  Mild pain on palpation.  Patient would like to discuss treatment options for nail fungus has not seen anyone as prior to seeing me denies any other acute complaints.  She would like to discuss treatment options for it   Review of Systems: Negative except as noted in the HPI. Denies N/V/F/Ch.  Past Medical History:  Diagnosis Date   Anxiety    Bulging of intervertebral disc between L4 and L5    Common migraine with intractable migraine 05/31/2016   Depression    Headache    Thyroid  disease     Current Outpatient Medications:    terbinafine  (LAMISIL ) 250 MG tablet, Take 1 tablet (250 mg total) by mouth daily., Disp: 90 tablet, Rfl: 0   Calcium-Vitamin D -Vitamin K (CALCIUM SOFT CHEWS PO), Take 1 each by mouth 3 (three) times daily., Disp: , Rfl:    levonorgestrel (MIRENA) 20 MCG/24HR IUD, by Intrauterine route., Disp: , Rfl:    levothyroxine  (SYNTHROID ) 125 MCG tablet, TAKE 1 TABLET BY MOUTH EVERY DAY, Disp: 90 tablet, Rfl: 0   Multiple Vitamins-Minerals (BARIATRIC MULTIVITAMINS/IRON PO), Take by mouth., Disp: , Rfl:    nystatin  (MYCOSTATIN ) 100000 UNIT/ML suspension, Take 5 mLs (500,000 Units total) by mouth 4 (four) times daily., Disp: 120 mL, Rfl: 0   predniSONE  (DELTASONE ) 10 MG tablet, Take 6 tabs on day 1, 5 tabs on day 2, 4 tabs on day 3, 3 tabs on day 4, 2 tabs on day 5, 1 tab on day 6, Disp: 21 tablet, Rfl: 0  Social History   Tobacco Use  Smoking Status Never  Smokeless Tobacco Never    No Known Allergies Objective:  There were no vitals filed for this visit. There is no height or weight on file to calculate BMI. Constitutional Well developed. Well nourished.  Vascular Dorsalis  pedis pulses palpable bilaterally. Posterior tibial pulses palpable bilaterally. Capillary refill normal to all digits.  No cyanosis or clubbing noted. Pedal hair growth normal.  Neurologic Normal speech. Oriented to person, place, and time. Epicritic sensation to light touch grossly present bilaterally.  Dermatologic Nails thickened elongated Shogry mycotic toenail x 1 mild pain on palpation Skin within normal limits  Orthopedic: Normal joint ROM without pain or crepitus bilaterally. No visible deformities. No bony tenderness.   Radiographs: None Assessment:   1. Long-term use of high-risk medication   2. Nail fungus   3. Onychomycosis due to dermatophyte    Plan:  Patient was evaluated and treated and all questions answered.  Left hallux onychomycosis -Educated the patient on the etiology of onychomycosis and various treatment options associated with improving the fungal load.  I explained to the patient that there is 3 treatment options available to treat the onychomycosis including topical, p.o., laser treatment.  Patient elected to undergo p.o. options with Lamisil /terbinafine  therapy.  In order for me to start the medication therapy, I explained to the patient the importance of evaluating the liver and obtaining the liver function test.  Once the liver function test comes back normal I will start him on 80-month course of Lamisil  therapy.  Patient understood all risk and would like to proceed with Lamisil  therapy.  I have  asked the patient to immediately stop the Lamisil  therapy if she has any reactions to it and call the office or go to the emergency room right away.  Patient states understanding   No follow-ups on file.

## 2024-02-06 LAB — HEPATIC FUNCTION PANEL
ALT: 11 IU/L (ref 0–32)
AST: 13 IU/L (ref 0–40)
Albumin: 4.4 g/dL (ref 3.9–4.9)
Alkaline Phosphatase: 59 IU/L (ref 41–116)
Bilirubin Total: 0.6 mg/dL (ref 0.0–1.2)
Bilirubin, Direct: 0.17 mg/dL (ref 0.00–0.40)
Total Protein: 7.1 g/dL (ref 6.0–8.5)

## 2024-02-06 MED ORDER — TERBINAFINE HCL 250 MG PO TABS: 250.0000 mg | ORAL_TABLET | Freq: Every day | ORAL | 0 refills | Status: AC

## 2024-03-02 NOTE — Progress Notes (Deleted)
 "  Subjective:    Patient ID: Carolyn Warner, female    DOB: 1976-02-16, 48 y.o.   MRN: [id number]  HPI  Pt presents to the clinic today for her annual exam.   Flu: 12/2022 Tetanus: unsure Covid: Pfizer x 2 Pap smear: 11/2019, [facility] in [place] Mammogram: 12/2019, [facility] in [place] Colon screening: 02/2022, [facility] screening: annually Dentist: biannually  Diet: She does eat meat. She consumes more veggies than fruits. She tries to avoid fried foods. She drinks mostly water. Exercise: None  Review of Systems     Past Medical History:  Diagnosis Date   Anxiety    Bulging of intervertebral disc between L4 and L5    Common migraine with intractable migraine 05/31/2016   Depression    Headache    Thyroid  disease     Current Outpatient Medications  Medication Sig Dispense Refill   Calcium-Vitamin D -Vitamin K (CALCIUM SOFT CHEWS PO) Take 1 each by mouth 3 (three) times daily.     levonorgestrel (MIRENA) 20 MCG/24HR IUD by Intrauterine route.     levothyroxine  (SYNTHROID ) 125 MCG tablet TAKE 1 TABLET BY MOUTH EVERY DAY 90 tablet 0   Multiple Vitamins-Minerals (BARIATRIC MULTIVITAMINS/IRON PO) Take by mouth.     nystatin  (MYCOSTATIN ) 100000 UNIT/ML suspension Take 5 mLs (500,000 Units total) by mouth 4 (four) times daily. 120 mL 0   predniSONE  (DELTASONE ) 10 MG tablet Take 6 tabs on day 1, 5 tabs on day 2, 4 tabs on day 3, 3 tabs on day 4, 2 tabs on day 5, 1 tab on day 6 21 tablet 0   terbinafine  (LAMISIL ) 250 MG tablet Take 1 tablet (250 mg total) by mouth daily. 90 tablet 0   No current facility-administered medications for this visit.    No Known Allergies  Family History  Problem Relation Age of Onset   Thyroid  disease Mother    Healthy Father    Alzheimer's disease Maternal Grandmother    Thyroid  cancer Maternal Grandmother    Stroke Paternal Grandmother    Thyroid  disease Son    Healthy Maternal Grandfather     Social History    Socioeconomic History   Marital status: Married    Spouse name: Not on file   Number of children: 3   Years of education: College   Highest education level: Associate degree: occupational, scientist, product/process development, or vocational program  Occupational History   Occupation: Administrator, [employer] care  Tobacco Use   Smoking status: Never   Smokeless tobacco: Never  Vaping Use   Vaping status: Never Used  Substance and Sexual Activity   Alcohol use: No   Drug use: No   Sexual activity: Yes    Birth control/protection: I.U.D.  Other Topics Concern   Not on file  Social History Narrative   Lives with 3 kids   Caffeine use: occasional (coffee)   Tea-half and half- once per week   Right-handed   Social Drivers of Health   Tobacco Use: Low Risk (09/02/2023)   Patient History    Smoking Tobacco Use: Never    Smokeless Tobacco Use: Never    Passive Exposure: Not on file  Financial Resource Strain: Not on file  Food Insecurity: Not on file  Transportation Needs: Not on file  Physical Activity: Not on file  Stress: Not on file  Social Connections: Not on file  Intimate Partner Violence: Not on file  Depression (PHQ2-9): Low Risk (09/02/2023)   Depression (PHQ2-9)    PHQ-2  Score: 2  Alcohol Screen: Not on file  Housing: Unknown (05/01/2023)   Received from [facility]   Epic    Unable to Pay for Housing in the Last Year: Not on file    Number of Times Moved in the Last Year: Not on file    At any time in the past 12 months, were you homeless or living in a shelter (including now)?: No  Utilities: Not on file  Health Literacy: Not on file     Constitutional: Pt reports frequent headaches. Denies fever, malaise, fatigue,or abrupt weight changes.  HEENT: Denies eye pain, eye redness, ear pain, ringing in the ears, wax buildup, runny nose, nasal congestion, bloody nose, or sore throat. Respiratory: Denies difficulty breathing, shortness of breath, cough or sputum production.    Cardiovascular: Denies chest pain, chest tightness, palpitations or swelling in the hands or feet.  Gastrointestinal: Pt reports intermittent constipation. Denies abdominal pain, bloating, diarrhea or blood in the stool.  GU: Denies urgency, frequency, pain with urination, burning sensation, blood in urine, odor or discharge. Musculoskeletal: Pt reports chronic muscle and joint pain. Denies decrease in range of motion, difficulty with gait, or joint swelling.  Skin: Denies redness, rashes, lesions or ulcercations.  Neurological: Denies dizziness, difficulty with memory, difficulty with speech or problems with balance and coordination.  Psych: Pt has a history of anxiety and depression. Denies SI/HI.  No other specific complaints in a complete review of systems (except as listed in HPI above).  Objective:   Physical Exam  There were no vitals taken for this visit.   Wt Readings from Last 3 Encounters:  09/02/23 190 lb 6.4 oz (86.4 kg)  07/31/23 190 lb 4 oz (86.3 kg)  03/03/23 190 lb 9.6 oz (86.5 kg)    General: Appears her stated age, obese, in NAD. Skin: Warm, dry and intact. HEENT: Head: normal shape and size; Eyes: sclera white, no icterus, conjunctiva pink, PERRLA and EOMs intact;  Neck:  Neck supple, trachea midline. No masses, lumps or thyromegaly present.  Cardiovascular: Normal rate and rhythm. S1,S2 noted.  No murmur, rubs or gallops noted. No JVD or BLE edema.  Pulmonary/Chest: Normal effort and positive vesicular breath sounds. No respiratory distress. No wheezes, rales or ronchi noted.  Abdomen: Normal bowel sounds.  Musculoskeletal: Strength 5/5 BUE/BLE. No difficulty with gait.  Neurological: Alert and oriented. Cranial nerves II-XII grossly intact. Coordination normal.  Psychiatric: Mood and affect normal. Behavior is normal. Judgment and thought content normal.    BMET    Component Value Date/Time   NA 140 03/14/2023 0807   K 3.9 03/14/2023 0807   CL 104  03/14/2023 0807   CO2 29 03/14/2023 0807   GLUCOSE 78 03/14/2023 0807   BUN 19 03/14/2023 0807   CREATININE 0.82 03/14/2023 0807   CALCIUM 9.6 03/14/2023 0807   GFRNONAA 103 05/11/2019 0855   GFRAA 120 05/11/2019 0855    Lipid Panel     Component Value Date/Time   CHOL 169 03/14/2023 0807   TRIG 121 03/14/2023 0807   HDL 48 (L) 03/14/2023 0807   CHOLHDL 3.5 03/14/2023 0807   LDLCALC 99 03/14/2023 0807    CBC    Component Value Date/Time   WBC 5.1 03/14/2023 0807   RBC 4.35 03/14/2023 0807   HGB 14.4 03/14/2023 0807   HCT 41.8 03/14/2023 0807   PLT 261 03/14/2023 0807   MCV 96.1 03/14/2023 0807   MCH 33.1 (H) 03/14/2023 0807   MCHC 34.4  03/14/2023 0807   RDW 13.0 03/14/2023 0807   LYMPHSABS 1,522 05/11/2019 0855   MONOABS 0.7 11/05/2018 0327   EOSABS 154 05/11/2019 0855   BASOSABS 29 05/11/2019 0855    Hgb A1C Lab Results  Component Value Date   HGBA1C 5.0 03/14/2023          Assessment & Plan:   Preventative Health Maintenance:  Flu shot  She declines tetanus Encouraged her to get a covid booster Pap smear UTD Mammogram due, she will call gyn to schedule this Colon screening UTD Encouraged her to consume a balanced diet and exercise regimen Advised her to see an eye doctor and dentist annually Will check CBC, CMET, TSH, free T4, lipid, A1C   RTC in 6 months, follow up chronic conditions Angeline Laura, NP  "

## 2024-03-03 ENCOUNTER — Encounter: Admitting: Internal Medicine
